# Patient Record
Sex: Male | Born: 1940 | Race: White | Hispanic: No | Marital: Married | Smoking: Current some day smoker
Health system: Southern US, Community
[De-identification: ages and names within clinical notes are randomized; demographics above are authoritative.]

## PROBLEM LIST (undated history)

## (undated) DIAGNOSIS — D472 Monoclonal gammopathy: Secondary | ICD-10-CM

## (undated) DIAGNOSIS — D6851 Activated protein C resistance: Secondary | ICD-10-CM

## (undated) DIAGNOSIS — G454 Transient global amnesia: Secondary | ICD-10-CM

## (undated) DIAGNOSIS — C801 Malignant (primary) neoplasm, unspecified: Secondary | ICD-10-CM

## (undated) DIAGNOSIS — Z87442 Personal history of urinary calculi: Secondary | ICD-10-CM

## (undated) DIAGNOSIS — I10 Essential (primary) hypertension: Secondary | ICD-10-CM

## (undated) DIAGNOSIS — I82409 Acute embolism and thrombosis of unspecified deep veins of unspecified lower extremity: Secondary | ICD-10-CM

## (undated) DIAGNOSIS — D759 Disease of blood and blood-forming organs, unspecified: Secondary | ICD-10-CM

## (undated) DIAGNOSIS — N400 Enlarged prostate without lower urinary tract symptoms: Secondary | ICD-10-CM

## (undated) DIAGNOSIS — H9319 Tinnitus, unspecified ear: Secondary | ICD-10-CM

## (undated) DIAGNOSIS — F419 Anxiety disorder, unspecified: Secondary | ICD-10-CM

## (undated) DIAGNOSIS — R319 Hematuria, unspecified: Secondary | ICD-10-CM

## (undated) HISTORY — PX: CHOLECYSTECTOMY: SHX55

## (undated) HISTORY — PX: SHOULDER SURGERY: SHX246

## (undated) HISTORY — PX: HAND SURGERY: SHX662

## (undated) HISTORY — PX: IVC FILTER PLACEMENT (ARMC HX): HXRAD1551

## (undated) HISTORY — PX: INNER EAR SURGERY: SHX679

## (undated) HISTORY — PX: ELBOW SURGERY: SHX618

## (undated) HISTORY — PX: APPENDECTOMY: SHX54

## (undated) HISTORY — PX: COLON SURGERY: SHX602

---

## 2005-12-16 ENCOUNTER — Inpatient Hospital Stay: Payer: Self-pay | Admitting: Internal Medicine

## 2006-01-02 ENCOUNTER — Encounter: Payer: Self-pay | Admitting: Internal Medicine

## 2009-10-17 ENCOUNTER — Inpatient Hospital Stay: Payer: Self-pay | Admitting: Internal Medicine

## 2009-11-06 ENCOUNTER — Ambulatory Visit: Payer: Self-pay | Admitting: Surgery

## 2010-02-06 ENCOUNTER — Ambulatory Visit: Payer: Self-pay | Admitting: Gastroenterology

## 2010-02-18 ENCOUNTER — Ambulatory Visit: Payer: Self-pay | Admitting: Surgery

## 2010-02-20 ENCOUNTER — Inpatient Hospital Stay: Payer: Self-pay | Admitting: Internal Medicine

## 2010-02-27 ENCOUNTER — Inpatient Hospital Stay: Payer: Self-pay | Admitting: Surgery

## 2010-03-01 LAB — PATHOLOGY REPORT

## 2010-03-15 ENCOUNTER — Inpatient Hospital Stay: Payer: Self-pay | Admitting: Surgery

## 2010-03-21 ENCOUNTER — Ambulatory Visit: Payer: Self-pay | Admitting: Surgery

## 2010-05-07 ENCOUNTER — Ambulatory Visit: Payer: Self-pay | Admitting: Vascular Surgery

## 2010-11-19 DIAGNOSIS — I749 Embolism and thrombosis of unspecified artery: Secondary | ICD-10-CM | POA: Insufficient documentation

## 2010-11-25 ENCOUNTER — Observation Stay: Payer: Self-pay | Admitting: Internal Medicine

## 2011-01-08 DIAGNOSIS — K319 Disease of stomach and duodenum, unspecified: Secondary | ICD-10-CM | POA: Insufficient documentation

## 2011-01-08 DIAGNOSIS — K30 Functional dyspepsia: Secondary | ICD-10-CM | POA: Insufficient documentation

## 2011-01-08 DIAGNOSIS — R1013 Epigastric pain: Secondary | ICD-10-CM | POA: Insufficient documentation

## 2011-07-16 LAB — COMPREHENSIVE METABOLIC PANEL
Albumin: 4.2 g/dL (ref 3.4–5.0)
BUN: 12 mg/dL (ref 7–18)
Bilirubin,Total: 0.6 mg/dL (ref 0.2–1.0)
Chloride: 112 mmol/L — ABNORMAL HIGH (ref 98–107)
Co2: 24 mmol/L (ref 21–32)
Creatinine: 1.2 mg/dL (ref 0.60–1.30)
Glucose: 93 mg/dL (ref 65–99)
Potassium: 4.1 mmol/L (ref 3.5–5.1)
SGPT (ALT): 24 U/L
Sodium: 144 mmol/L (ref 136–145)
Total Protein: 7.6 g/dL (ref 6.4–8.2)

## 2011-07-16 LAB — URINALYSIS, COMPLETE
Glucose,UR: NEGATIVE mg/dL (ref 0–75)
Leukocyte Esterase: NEGATIVE
Ph: 6 (ref 4.5–8.0)
Protein: 100
RBC,UR: 3751 /HPF (ref 0–5)
WBC UR: 9 /HPF (ref 0–5)

## 2011-07-16 LAB — CBC
HCT: 46.9 % (ref 40.0–52.0)
HGB: 16.1 g/dL (ref 13.0–18.0)
MCV: 99 fL (ref 80–100)
RBC: 4.76 10*6/uL (ref 4.40–5.90)
RDW: 13.1 % (ref 11.5–14.5)
WBC: 8.2 10*3/uL (ref 3.8–10.6)

## 2011-07-16 LAB — LIPASE, BLOOD: Lipase: 200 U/L (ref 73–393)

## 2011-07-16 LAB — PROTIME-INR: INR: 1.6

## 2011-07-17 ENCOUNTER — Observation Stay: Payer: Self-pay | Admitting: Urology

## 2011-07-18 LAB — PROTIME-INR: INR: 2.1

## 2011-07-19 LAB — PROTIME-INR: INR: 2.2

## 2011-07-22 DIAGNOSIS — D472 Monoclonal gammopathy: Secondary | ICD-10-CM | POA: Insufficient documentation

## 2011-07-22 DIAGNOSIS — N209 Urinary calculus, unspecified: Secondary | ICD-10-CM | POA: Insufficient documentation

## 2011-07-22 DIAGNOSIS — K5790 Diverticulosis of intestine, part unspecified, without perforation or abscess without bleeding: Secondary | ICD-10-CM | POA: Insufficient documentation

## 2011-07-22 DIAGNOSIS — K5730 Diverticulosis of large intestine without perforation or abscess without bleeding: Secondary | ICD-10-CM | POA: Insufficient documentation

## 2011-07-25 ENCOUNTER — Ambulatory Visit: Payer: Self-pay | Admitting: Urology

## 2011-08-25 ENCOUNTER — Ambulatory Visit: Payer: Self-pay | Admitting: Urology

## 2011-08-25 DIAGNOSIS — Z7901 Long term (current) use of anticoagulants: Secondary | ICD-10-CM | POA: Insufficient documentation

## 2011-08-27 ENCOUNTER — Ambulatory Visit: Payer: Self-pay | Admitting: Urology

## 2012-08-25 ENCOUNTER — Ambulatory Visit: Payer: Self-pay | Admitting: Urology

## 2012-08-25 DIAGNOSIS — N401 Enlarged prostate with lower urinary tract symptoms: Secondary | ICD-10-CM | POA: Insufficient documentation

## 2012-08-26 DIAGNOSIS — R39198 Other difficulties with micturition: Secondary | ICD-10-CM | POA: Insufficient documentation

## 2012-08-26 DIAGNOSIS — R310 Gross hematuria: Secondary | ICD-10-CM | POA: Insufficient documentation

## 2012-08-26 DIAGNOSIS — R300 Dysuria: Secondary | ICD-10-CM | POA: Insufficient documentation

## 2012-09-01 DIAGNOSIS — G454 Transient global amnesia: Secondary | ICD-10-CM | POA: Insufficient documentation

## 2012-09-01 DIAGNOSIS — I82409 Acute embolism and thrombosis of unspecified deep veins of unspecified lower extremity: Secondary | ICD-10-CM | POA: Insufficient documentation

## 2012-09-01 DIAGNOSIS — D472 Monoclonal gammopathy: Secondary | ICD-10-CM | POA: Insufficient documentation

## 2012-09-01 DIAGNOSIS — Z9049 Acquired absence of other specified parts of digestive tract: Secondary | ICD-10-CM | POA: Insufficient documentation

## 2012-09-01 DIAGNOSIS — N210 Calculus in bladder: Secondary | ICD-10-CM | POA: Insufficient documentation

## 2012-09-01 DIAGNOSIS — D6851 Activated protein C resistance: Secondary | ICD-10-CM | POA: Insufficient documentation

## 2012-09-01 DIAGNOSIS — K5730 Diverticulosis of large intestine without perforation or abscess without bleeding: Secondary | ICD-10-CM | POA: Insufficient documentation

## 2012-10-25 DIAGNOSIS — I10 Essential (primary) hypertension: Secondary | ICD-10-CM | POA: Insufficient documentation

## 2012-10-25 DIAGNOSIS — G4700 Insomnia, unspecified: Secondary | ICD-10-CM | POA: Insufficient documentation

## 2013-09-13 DIAGNOSIS — E785 Hyperlipidemia, unspecified: Secondary | ICD-10-CM | POA: Insufficient documentation

## 2013-09-13 DIAGNOSIS — F411 Generalized anxiety disorder: Secondary | ICD-10-CM | POA: Insufficient documentation

## 2014-02-10 DIAGNOSIS — Z8249 Family history of ischemic heart disease and other diseases of the circulatory system: Secondary | ICD-10-CM | POA: Insufficient documentation

## 2014-05-16 NOTE — Op Note (Signed)
PATIENT NAME:  Elijah Lopez, Elijah Lopez MR#:  [id number] DATE OF BIRTH:  25-May-1940  DATE OF PROCEDURE:  08/27/2011  PREOPERATIVE DIAGNOSIS: Right distal ureteral calculus.   POSTOPERATIVE DIAGNOSIS: Right distal ureteral calculus.   PROCEDURES:  1. Right ureteroscopy with stone removal.  2. Placement of right ureteral stent.   SURGEON: Scott C. Bernardo Heater, M.D.   ASSISTANT: None.   ANESTHESIA: General.   INDICATIONS: This is a 75 year old white male originally admitted in mid June 2013 with right renal colic secondary to 2 mm right distal ureteral calculus. He was having persistent nausea and elected ureteroscopic removal. He was taken to the operating room on 07/17/2011. The distal ureter was noted to be very small and could not be accessed with a ureteroscope. A small ureteral perforation was noted and a stent was placed without problems. He is not aware of passing a stone. He is having moderate stent symptoms. He presents for stent removal and ureteroscopy.   DESCRIPTION OF PROCEDURE: The patient was taken to the operating room where a general anesthetic was administered. He was placed in the low lithotomy position and his external genitalia were prepped and draped sterilely. Time out was performed. A 21 French cystoscope with 30 degree lens was lubricated and passed under direct vision. The urethra was normal in caliber without stricture. The prostate was remarkable for moderate lateral lobe enlargement. Bladder mucosa was closely inspected. There was no evidence of stone, solid or papillary lesions. The distal end of the ureteral stent was grasped with an endoscopic forceps and brought out through the urethral meatus. A 0.035 Glidewire was placed through the stent and passed up into the renal pelvis under fluoroscopic guidance. The stent was removed. A 6 French semirigid ureteroscope was then passed under direct vision. A 0.025 guidewire was placed through the ureteroscope and into the right ureteral  orifice. The right ureteral orifice was easily engaged. The area had been reepithelized. Within 1 cm of the ureter, a small stone was visualized. It was slightly embedded into the wall of the ureter and was dislodged with the tip of a 3 [place] nitinol basket. The stone was easily ensnared in the basket and removed. The ureteroscope was repassed. The guidewire was removed and retrograde pyelogram did show some persistent hang of contrast to the distal ureter. It was elected to replace the stent temporarily. The 0.035 guidewire was placed through the cystoscope and up into the renal pelvis. A 6 French/24 cm Contour ureteral stent was placed. There was good curl seen in the renal pelvis. The distal end of the stent was well positioned in the bladder. The bladder was emptied and the cystoscope was removed. The patient was taken to the PAC-U in stable condition. There were no complications. EBL was minimal.  ____________________________ Ronda Fairly. Bernardo Heater, MD scs:slb D: 08/27/2011 08:31:14 ET T: 08/27/2011 12:01:06 ET JOB#: [id number]  cc: Nicki Reaper C. Bernardo Heater, MD, <Dictator> Abbie Sons MD ELECTRONICALLY SIGNED 09/08/2011 13:24

## 2014-05-21 NOTE — Discharge Summary (Signed)
PATIENT NAME:  Elijah Lopez, Elijah Lopez MR#:  [id number] DATE OF BIRTH:  1940-10-07  DATE OF ADMISSION:  07/17/2011 DATE OF DISCHARGE:  07/19/2011  ADMISSION DIAGNOSES:  1. Right renal colic.  2. Right distal ureteral calculus.   DISCHARGE DIAGNOSES:  1. Right renal colic.  2. Right distal ureteral calculus.  3. Urinary retention.   PROCEDURE: 07/17/2011 cystoscopy with placement of right ureteral stent.   HISTORY OF PRESENT ILLNESS: 74 year old male presented to the Emergency Department 06/19 with severe right renal colic. CT was remarkable for mild hydronephrosis and a 2 mm right distal ureteral calculus. His pain could not be adequately controlled requiring admission. Refer to the admission history and physical for details.   HOSPITAL COURSE: Upon admission patient's pain significantly improved, however, he had persistent nausea and vomiting. He elected to proceed with ureteroscopy and was taken to the Operating Room on 07/17/2011. He was noted to have a very small ureter and the standard ureteroscope could not be advanced. A small ureteral perforation was noted. No further attempts at stone extraction were made and a stent was placed. After stent placement he had resolution of his pain and nausea. He did develop postoperative urinary retention which was managed with Foley catheter drainage. On 06/22 his catheter was removed and he was able to void at baseline.   DISPOSITION: Patient is discharged to home with activity as tolerated.   FOLLOW UP: He will follow up in approximately two weeks. His stent will be left indwelling approximately four weeks.   DISCHARGE MEDICATIONS:  1. Ceftin 500 mg p.o. b.i.d. x1 week.  2. Tamsulosin 0.4 mg p.o. daily.   CONDITION AT DISCHARGE: Good.   PROGNOSIS: Good.  ____________________________ Ronda Fairly. Bernardo Heater, MD scs:cms D: 07/19/2011 17:50:39 ET T: 07/20/2011 12:10:22 ET JOB#: [id number]  cc: Nicki Reaper C. Bernardo Heater, MD, <Dictator>  Abbie Sons  MD ELECTRONICALLY SIGNED 08/11/2011 9:12

## 2014-05-21 NOTE — Op Note (Signed)
PATIENT NAME:  Elijah Lopez, Elijah Lopez MR#:  [id number] DATE OF BIRTH:  07-Jun-1940  DATE OF PROCEDURE:  07/17/2011  PREOPERATIVE DIAGNOSIS: Right distal ureteral calculus.   POSTOPERATIVE DIAGNOSIS:  Right distal ureteral calculus.  PROCEDURES:  1. Right ureteroscopy.  2. Placement of right ureteral stent.   SURGEON: John Giovanni, M.D.   ASSISTANT: None.   ANESTHESIA: General.   INDICATIONS: 74 year old male admitted with severe right flank pain, nausea, and vomiting. CT was remarkable for a 2-mm right distal ureteral calculus with mild hydronephrosis. His pain had improved and he was having significant persistent nausea. He desired to proceed with ureteroscopic removal.   DESCRIPTION OF PROCEDURE: The patient was taken to the operating room where a general anesthetic was administered. He was placed in the low lithotomy position and his external genitalia were prepped and draped in the usual fashion. A 21 French cystoscope with 30-degree lens was lubricated and passed under direct vision. The urethra was normal in caliber without stricture. The prostate was remarkable for a moderate lateral lobe enlargement/bladder neck elevation. Panendoscopy was performed and the bladder mucosa was normal in appearance without erythema, solid or papillary lesions. There was clear efflux seen from the left ureteral orifice. No efflux was seen from the right ureteral orifice. A 0.035 Glidewire was placed through the cystoscope into the right ureter. The wire was placed up to the renal pelvis under fluoroscopic guidance. The cystoscope was removed and a 6 French semirigid ureteroscope was passed per urethra. The ureteral orifice visually appeared small, as most of the room was occupied by the previously placed guidewire. A second 0.025 guidewire was placed through the ureteroscope and into the right ureter. The ureteroscope was then advanced over the guidewire into the distal ureter. The ureter was quite tight and the  scope was gently advanced; however, it could not be easily advanced proximally. The scope was slightly withdrawn and a small ureteral perforation was noted. It was elected at this point to abandon the procedure and place a ureteral stent. The guidewire was back-loaded on a cystoscope. A 5 French open-ended ureteral catheter was then placed over the wire up to the ureteropelvic junction. Retrograde pyelogram was performed, which demonstrated the catheter in the correct position. No extravasation was noted. There was mild hydronephrosis present. After initial placement of the catheter, a significant hydronephrotic drip was noted. The guidewire was replaced and the ureteral catheter withdrawn. A 5 French/26-cm Contour ureteral stent was placed over the wire. Due to the small nature of the renal pelvis, a good curl was not able to be obtained. The proximal end of the stent was crooked within an upper pole calyx. The distal end of the stent was well positioned in the bladder. Significant efflux of urine was noted from the stent after placement. The bladder was emptied and the cystoscope was removed. The patient was taken to the PAC-U in stable condition.    ____________________________ Ronda Fairly Bernardo Heater, MD scs:bjt D: 07/19/2011 17:47:31 ET T: 07/20/2011 10:50:04 ET JOB#: [id number]  cc: Nicki Reaper C. Bernardo Heater, MD, <Dictator> Abbie Sons MD ELECTRONICALLY SIGNED 08/11/2011 9:11

## 2014-05-21 NOTE — H&P (Signed)
PATIENT NAME:  Elijah Lopez, Elijah Lopez MR#:  [id number] DATE OF BIRTH:  11/01/1940  DATE OF ADMISSION:  07/17/2011  ADMISSION DIAGNOSIS: Right renal colic.   HISTORY OF PRESENT ILLNESS: This is a 74 year old white male who developed right lower quadrant abdominal pain on 07/15/2011. Pain was moderate in severity with radiation to the right groin and right testis. He had significant increase in his pain on 06/19 associated with nausea and vomiting. He presented to the Emergency Department on the afternoon of 06/19. A stone protocol CT was performed which showed a 2 to 3 mm right distal ureteral calculus with mild hydronephrosis. Over the course of the afternoon, evening and early morning he received a total of 20 mg of morphine in addition to Toradol. His pain could not be adequately controlled and admission was requested. He denies any prior history of stone disease. He has had a prior history of postoperative urinary retention. Denies fever or chills.   PAST MEDICAL HISTORY:  1. 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  2. History of diverticulitis, status post colostomy in 2012.  3. Chronic tinnitus.  4. 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   PAST SURGICAL HISTORY:  1. Left shoulder surgery. 2. Right ear implant.  3. Colostomy in 2012.  4. Exploration/partial colon resection, takedown of colostomy December 2012.  5. Right hand/elbow surgery.  6. Removal of IVC filter April 2012.  ALLERGIES: He has multiple medication intolerances/allergies including amoxicillin, Ativan, codeine, Dilaudid, Elavil, erythromycin, hydrochlorothiazide, Naprosyn, Lecithin, Neurontin.   MEDICATIONS ON ADMISSION:  1. Zolpidem 10 mg at bedtime as needed. 2. Prilosec OTC 20 mg daily as needed. 3. Coumadin 4 mg Saturday and Sunday weekly; Coumadin 3 mg Monday through Friday. 4. Colace as needed.  5. Clonazepam 1 mg 3 times daily as needed.   6. ASA 81 mg as needed.   SOCIAL HISTORY: Denies tobacco or alcohol use.   REVIEW OF SYSTEMS: GENERAL: Denies fevers, chills. HEAD AND NECK: Denies headache. Chronic tinnitus. PULMONARY: Denies cough or shortness of breath. CARDIOVASCULAR: Denies chest pain, palpitations. GI: Positive abdominal pain, nausea, vomiting. GU: Denies frequency, urgency, dysuria, hematuria. ENDOCRINE: No thyroid disorder, diabetes. NEUROLOGIC: Possible TIA in the past, currently asymptomatic. HEME: Hypercoagulable disorder. MUSCULOSKELETAL: No new joint/back pain. PSYCH: History of anxiety.   PHYSICAL EXAMINATION:   VITAL SIGNS: Temperature 98.2, blood pressure 133/82, pulse 91.   GENERAL: The patient was in no acute distress.   HEENT: Moist mucous membranes.    PULMONARY: Lungs clear to auscultation.   CARDIOVASCULAR: Regular rate and rhythm without murmur.   ABDOMEN: Soft, nontender. No CVA tenderness.   GU: Deferred at this time.   EXTREMITIES: No edema.  NEUROLOGIC: No focal deficits.   DATA: Urinalysis amber with significant microhematuria. INR was 1.6. Creatinine 1.2. Hemoglobin and hematocrit 16.1/46.9.   CT scan was reviewed. There is approximately 2 to 3 mm right distal ureteral calculus with mild hydronephrosis/hydroureter.   IMPRESSION: Right renal colic secondary to a small right distal ureteral calculus.   PLAN:  1. The patient is admitted for parenteral analgesia.  2. Surgical options will also be discussed.  3. Start tamsulosin.   ____________________________ Ronda Fairly. Bernardo Heater, MD scs:drc D: 07/17/2011 08:08:49 ET T: 07/17/2011 10:10:10 ET JOB#: [id number]  cc: Nicki Reaper C. Bernardo Heater, MD, <Dictator> Abbie Sons MD ELECTRONICALLY SIGNED 08/13/2011 16:37

## 2014-08-12 DIAGNOSIS — R7303 Prediabetes: Secondary | ICD-10-CM | POA: Insufficient documentation

## 2014-11-14 DIAGNOSIS — Z85828 Personal history of other malignant neoplasm of skin: Secondary | ICD-10-CM | POA: Insufficient documentation

## 2015-06-24 DIAGNOSIS — R740 Nonspecific elevation of levels of transaminase and lactic acid dehydrogenase [LDH]: Secondary | ICD-10-CM

## 2015-06-24 DIAGNOSIS — R7401 Elevation of levels of liver transaminase levels: Secondary | ICD-10-CM | POA: Insufficient documentation

## 2015-07-17 DIAGNOSIS — F419 Anxiety disorder, unspecified: Secondary | ICD-10-CM | POA: Insufficient documentation

## 2015-10-28 DIAGNOSIS — Z9049 Acquired absence of other specified parts of digestive tract: Secondary | ICD-10-CM | POA: Insufficient documentation

## 2015-11-14 DIAGNOSIS — D6851 Activated protein C resistance: Secondary | ICD-10-CM | POA: Insufficient documentation

## 2015-11-21 ENCOUNTER — Encounter
Admission: RE | Admit: 2015-11-21 | Discharge: 2015-11-21 | Disposition: A | Discharge status: Home / Self Care | Payer: Medicare Other | Source: Ambulatory Visit | Attending: Otolaryngology | Admitting: Otolaryngology

## 2015-11-21 HISTORY — DX: Personal history of urinary calculi: Z87.442

## 2015-11-21 HISTORY — DX: Activated protein C resistance: D68.51

## 2015-11-21 HISTORY — DX: Disease of blood and blood-forming organs, unspecified: D75.9

## 2015-11-21 HISTORY — DX: Acute embolism and thrombosis of unspecified deep veins of unspecified lower extremity: I82.409

## 2015-11-21 HISTORY — DX: Anxiety disorder, unspecified: F41.9

## 2015-11-21 HISTORY — DX: Essential (primary) hypertension: I10

## 2015-11-21 HISTORY — DX: Malignant (primary) neoplasm, unspecified: C80.1

## 2015-11-21 HISTORY — DX: Benign prostatic hyperplasia without lower urinary tract symptoms: N40.0

## 2015-11-21 HISTORY — DX: Hematuria, unspecified: R31.9

## 2015-11-21 HISTORY — DX: Transient global amnesia: G45.4

## 2015-11-21 HISTORY — DX: Monoclonal gammopathy: D47.2

## 2015-11-21 HISTORY — DX: Tinnitus, unspecified ear: H93.19

## 2015-11-21 NOTE — Pre-Procedure Instructions (Signed)
CBC Component Name  Office Visit on 11/14/2015 [place]")' href="[email or web address]/">11/14/2015  Office Visit on 03/13/2015 [employer]")' href="[email or web address]/13/2015  Office Visit on 02/02/2013 [place]")' href="[email or web address]/02/2013  Office Visit on 10/25/2012 [place]")' href="[email or web address]/25/2012    Specimen: Blood")'>16.5  Specimen: Blood")'>16.3  Specimen: Blood")'>16.5 16.6   Specimen: Blood")'>47.4  Specimen: Blood")'>49.4 (H)  Specimen: Blood")'>0.47 0.46     4.73     35.0 (H)     35.7     13.4     98     244     7.5     5.7   Specimen: Blood")'>6.0  Specimen: Blood")'>6.3  Specimen: Blood")'>5.0    Specimen: Blood")'>4.80  Specimen: Blood")'>5.01  Specimen: Blood")'>4.80    Specimen: Blood")'>99 (H)  Specimen: Blood")'>99 (H)  Specimen: Blood")'>98    Specimen: Blood")'>34.4 (H)  Specimen: Blood")'>32.5  Specimen: Blood")'>34.3 (H)    Specimen: Blood")'>34.8  Specimen: Blood")'>33.0  Specimen: Blood")'>35.1    Specimen: Blood")'>278  Specimen: Blood")'>258  Specimen: Blood")'>255    Specimen: Blood")'>13.3  Specimen: Blood")'>13.3  Specimen: Blood")'>13.2    Specimen: Blood")'>10.4  Specimen: Blood")'>9.7  Specimen: Blood")'>7.6   Hemoglobin  Hematocrit  Red Blood Cell Count  MCH  MCHC  RDW-CV  MCV  Platelet Count /L  MPV  White Blood Cell Count  WBC (White Blood Cell Count)  RBC (Red Blood Cell Count)  MCV (Mean Corpuscular Volume)  MCH (Mean Corpuscular Hemoglobin)  MCHC (Mean Corpuscular Hemoglobin Concentration)  Plt (platelets)  RDW-CV (Red Cell Distribution Width)  MPV (Mean Platelet Volume)

## 2015-11-21 NOTE — Pre-Procedure Instructions (Signed)
Cleared by hematologist low risk and patient aware to no take xarelto tonight

## 2015-11-21 NOTE — Pre-Procedure Instructions (Deleted)
Name Value Ref Range  Vent Rate (bpm) 81   PR Interval (msec) 150   QRS Interval (msec) 78   QT Interval (msec) 382   QTc (msec) 443   Result Narrative  Normal sinus rhythm Normal ECG  No previous ECGs available I reviewed and concur with this report. Electronically signed ZY:SAYTK, MD, AUGUSTUS ([id number]) on 11/14/2015 4:15:39 PM  Status Results Details      Component Name  Office Visit on 10/18/2017Name  Office Visit on 11/14/2015 [place]")' href="epic://request1.2.840.114350.[id number]/">11/14/2015  Office Visit on 03/13/2015 [place]")' href="epic://request1.2.840.114350.1.1[id number]/">03/13/2015  Office Visit on 05/03/2013 [place]")' href="epic://request1.2.840.114350.1.13.324.2.7.8.688883.135349029/">05/03/2013  Office Visit on 03/17/2013 [place]")' href="epic://request1.2.840.114350.1.13.324.2.7.8.688883.135349032/">03/17/2013  Office Visit on 02/02/2013 [place]")' href="epic://request1.2.840.114350.1.13.324.2.7.8.688883.135349036/">02/02/2013  Office Visit on 10/25/2012 [place]")' href="epic://request1.2.840.114350.1.13.324.2.7.8.688883.135349041/">10/25/2012  Office Visit on 01/12/2012 [place]")' href="epic://request1.2.840.114350.1.13.324.2.7.8.688883.135349059/">01/12/2012  Office Visit on 01/08/2011 [place]")' href="epic://request1.2.840.114350.1.13.324.2.7.8.688883.135349081/">01/08/2011  Appointment on 06/20/2010 [place]")' href="epic://request1.2.840.114350.1.13.324.2.7.8.688883.135349292/">06/20/2010  Historical Outpatient on 12/26/2009 [place]")' href="epic://request1.2.840.114350.1.13.324.2.7.8.688883.135349120/">12/26/2009  Historical Outpatient on 12/15/2008 [place]")'  href="epic://request1.2.840.114350.1.13.324.2.7.8.688883.135349150/">12/15/2008  Historical Outpatient on 12/14/2007 [place]")' href="epic://request1.2.840.114350.1.13.324.2.7.8.688883.135349166/">12/14/2007  Historical Outpatient on 06/15/2007 [place]")' href="epic://request1.2.840.114350.1.13.324.2.7.8.688883.135349180/">06/15/2007  Historical Outpatient on 01/06/2007 [place]")' href="epic://request1.2.840.114350.1.13.324.2.7.8.688883.135349197/">01/06/2007  Historical Outpatient on 12/04/2005 [place]")' href="epic://request1.2.840.114350.1.13.324.2.7.8.688883.135349220/">12/04/2005  Historical Outpatient on 05/22/2005 [place]")' href="epic://request1.2.840.114350.1.13.324.2.7.8.688883.135349227/">05/22/2005  Historical Outpatient on 03/11/2005 [place]")' href="epic://request1.2.840.114350.1.13.324.2.7.8.688883.135349232/">03/11/2005  Orders Only on 11/25/1999 [place]")' href="epic://request1.2.840.114350.1.13.324.2.7.8.688883.135349283/">11/25/1999  Orders Only on 11/25/1999 [place]")' href="epic://request1.2.840.114350.1.13.324.2.7.8.688883.135349280/">11/25/1999  Orders Only on 10/24/1993 [place]")' href="epic://request1.2.840.114350.1.13.324.2.7.8.688883.135349267/">10/24/1993  Orders Only on 10/24/1993 [place]")' href="epic://request1.2.840.114350.1.13.324.2.7.8.688883.135349269/">10/24/1993   View Detailed Result Report Comprehensive Metabolic Panel CMP 16/01/930")' href="epic://ordersummary1.2.840.114350.1.13.324.2.7.2.798268^[id number] Comprehensive Metabolic Panel CMP/"> Specimen: Blood")'>141 View Detailed Result Report Comprehensive Metabolic Panel CMP 3/55/7322")'  href="epic://ordersummary1.2.840.114350.1.13.324.2.7.2.798268^[id number] Comprehensive Metabolic Panel CMP/"> Specimen: Blood")'>141 View Detailed Result Report Basic Metabolic Panel BMP 0/02/5425")' href="epic://ordersummary1.0.623.762831.5.17.616.0.7.3.710626^[id number] Basic Metabolic Panel BMP/"> Specimen: Blood")'>140 View Detailed Result Report Basic Metabolic Panel BMP 3/50/0938")' href="epic://ordersummary1.1.829.937169.6.78.938.1.0.1.751025^[id number] Basic Metabolic Panel BMP/"> Specimen: Blood")'>140 View Detailed Result Report Comprehensive Metabolic Panel CMP 03/31/3612")' href="epic://ordersummary1.2.840.114350.1.13.324.2.7.2.798268^[id number] Comprehensive Metabolic Panel CMP/"> Specimen: Blood")'>141 View Detailed Result Report Comprehensive Metabolic Panel CMP 4/31/5400")' href="epic://ordersummary1.2.840.114350.1.13.324.2.7.2.798268^[id number] Comprehensive Metabolic Panel CMP/"> Specimen: Blood")'>143 View Detailed Result Report Comprehensive Metabolic Panel CMP 86/76/1950")' href="epic://ordersummary1.2.840.114350.1.13.324.2.7.2.798268^[id number] Comprehensive Metabolic Panel DTO/">671 View Detailed Result Report DUAP Comprehensive Metabolic Panel 24/58/0998")' href="epic://ordersummary1.2.840.114350.1.13.324.2.7.2.798268^[id number] DUAP Comprehensive Metabolic PJASN/">053 View Detailed Result Report DUAP Comprehensive Metabolic Panel 9/76/7341")' href="epic://ordersummary1.2.840.114350.1.13.324.2.7.2.798268^[id number] DUAP Comprehensive Metabolic PFXTK/">240 View Detailed Result Report DUAP Comprehensive Metabolic Panel 97/35/3299")' href="epic://ordersummary1.2.840.114350.1.13.324.2.7.2.798268^[id number] DUAP Comprehensive Metabolic MEQAS/">341 View Detailed Result Report Comprehensive Metabolic Panel 96/22/2979")' href="epic://ordersummary1.2.840.114350.1.13.324.2.7.2.798268^14040542 Comprehensive Metabolic GXQJJ/">941 View Detailed Result Report Comprehensive  Metabolic Panel 74/08/1446")' href="epic://ordersummary1.2.840.114350.1.13.324.2.7.2.798268^[id number] Comprehensive Metabolic JEHUD/">149 View Detailed Result Report DUAP Comprehensive Metabolic Panel 07/28/6376")' href="epic://ordersummary1.2.840.114350.1.13.324.2.7.2.798268^[id number] DUAP Comprehensive Metabolic HYIFO/">277 View Detailed Result Report DUAP Comprehensive Metabolic Panel 41/28/7867")' href="epic://ordersummary1.2.840.114350.1.13.324.2.7.2.798268^[id number] DUAP Comprehensive Metabolic EHMCN/">470 View Detailed Result Report DUAP COMP.METABOLIC PANEL 96/02/8364")' href="epic://ordersummary1.2.840.114350.1.13.324.2.7.2.798268^4964830 DUAP COMP.METABOLIC QHUTM/">546 View Detailed Result Report DUAP COMP.METABOLIC PANEL 05/30/5463")' href="epic://ordersummary1.2.840.114350.1.13.324.2.7.2.798268^[id number] DUAP COMP.METABOLIC KCLEX/">517 View Detailed Result Report DUAP COMP.METABOLIC PANEL 0/01/7492")' href="epic://ordersummary1.2.840.114350.1.13.324.2.7.2.798268^[id number] DUAP COMP.METABOLIC WHQPR/">916      View Detailed Result Report Comprehensive Metabolic Panel CMP 38/46/6599")' href="epic://ordersummary1.2.840.114350.1.13.324.2.7.2.798268^[id number] Comprehensive Metabolic Panel CMP/"> Specimen: Blood")'>4.1 View Detailed Result Report Comprehensive Metabolic Panel CMP 3/57/0177")' href="epic://ordersummary1.2.840.114350.1.13.324.2.7.2.798268^[id number] Comprehensive Metabolic Panel CMP/"> Specimen: Blood")'>4.3 View Detailed Result Report Basic Metabolic Panel BMP 09/29/9028")' href="epic://ordersummary1.0.923.300762.2.63.335.4.5.6.256389^[id number] Basic Metabolic Panel BMP/"> Specimen: Blood")'>4.5 View Detailed Result Report Basic Metabolic Panel BMP 02/11/7260")' href="epic://ordersummary1.0.355.974163.8.45.364.6.8.0.321224^[id number] Basic Metabolic Panel BMP/"> Specimen: Blood")'>4.4 View Detailed Result Report Comprehensive Metabolic Panel CMP 09/04/8914")'  href="epic://ordersummary1.2.840.114350.1.13.324.2.7.2.798268^[id number] Comprehensive Metabolic Panel CMP/"> Specimen: Blood")'>4.2 View Detailed  Result Report Comprehensive Metabolic Panel CMP 0/10/9321")' href="epic://ordersummary1.2.840.114350.1.13.324.2.7.2.798268^[id number] Comprehensive Metabolic Panel CMP/"> Specimen: Blood")'>4.0 View Detailed Result Report Comprehensive Metabolic Panel CMP 55/73/2202")' href="epic://ordersummary1.2.840.114350.1.13.324.2.7.2.798268^[id number] Comprehensive Metabolic Panel RKY/">7.0    View Detailed Result Report Comprehensive Metabolic Panel 62/37/6283")' href="epic://ordersummary1.2.840.114350.1.13.324.2.7.2.798268^14040542 Comprehensive Metabolic TDVVO/">1.6 View Detailed Result Report Comprehensive Metabolic Panel 07/37/1062")' href="epic://ordersummary1.2.840.114350.1.13.324.2.7.2.798268^[id number] Comprehensive Metabolic IRSWN/">4.6   View Detailed Result Report DUAP COMP.METABOLIC PANEL 27/0/3500")' href="epic://ordersummary1.2.840.114350.1.13.324.2.7.2.798268^4964830 DUAP COMP.METABOLIC XFGHW/">2.9 View Detailed Result Report DUAP COMP.METABOLIC PANEL 9/37/1696")' href="epic://ordersummary1.2.840.114350.1.13.324.2.7.2.798268^[id number] DUAP COMP.METABOLIC VELFY/">1.0 View Detailed Result Report DUAP COMP.METABOLIC PANEL 1/75/1025")' href="epic://ordersummary1.2.840.114350.1.13.324.2.7.2.798268^[id number] DUAP COMP.METABOLIC ENIDP/">8.2      View Detailed Result Report Comprehensive Metabolic Panel CMP 42/35/3614")' href="epic://ordersummary1.2.840.114350.1.13.324.2.7.2.798268^[id number] Comprehensive Metabolic Panel CMP/"> Specimen: Blood")'>106 View Detailed Result Report Comprehensive Metabolic Panel CMP 4/31/5400")' href="epic://ordersummary1.2.840.114350.1.13.324.2.7.2.798268^[id number] Comprehensive Metabolic Panel CMP/"> Specimen: Blood")'>106 View Detailed Result Report Basic Metabolic Panel BMP 09/01/7617")'  href="epic://ordersummary1.5.093.267124.5.80.998.3.3.8.250539^767341937 Basic Metabolic Panel BMP/"> Specimen: Blood")'>106 View Detailed Result Report Basic Metabolic Panel BMP 09/29/4095")' href="epic://ordersummary1.3.532.992426.8.34.196.2.2.2.979892^119417408 Basic Metabolic Panel BMP/"> Specimen: Blood")'>105 View Detailed Result Report Comprehensive Metabolic Panel CMP 01/31/4816")' href="epic://ordersummary1.2.840.114350.1.13.324.2.7.2.798268^[id number] Comprehensive Metabolic Panel CMP/"> Specimen: Blood")'>105 View Detailed Result Report Comprehensive Metabolic Panel CMP 5/63/1497")' href="epic://ordersummary1.2.840.114350.1.13.324.2.7.2.798268^188724190 Comprehensive Metabolic Panel CMP/"> Specimen: Blood")'>106 View Detailed Result Report Comprehensive Metabolic Panel CMP 02/63/7858")' href="epic://ordersummary1.2.840.114350.1.13.324.2.7.2.798268^[id number] Comprehensive Metabolic Panel IFO/">277 View Detailed Result Report DUAP Comprehensive Metabolic Panel 41/28/7867")' href="epic://ordersummary1.2.840.114350.1.13.324.2.7.2.798268^[id number] DUAP Comprehensive Metabolic EHMCN/">470 View Detailed Result Report DUAP Comprehensive Metabolic Panel 9/62/8366")' href="epic://ordersummary1.2.840.114350.1.13.324.2.7.2.798268^32840825 DUAP Comprehensive Metabolic QHUTM/">546 View Detailed Result Report DUAP Comprehensive Metabolic Panel 50/35/4656")' href="epic://ordersummary1.2.840.114350.1.13.324.2.7.2.798268^[id number] DUAP Comprehensive Metabolic CLEXN/">170 View Detailed Result Report Comprehensive Metabolic Panel 01/74/9449")' href="epic://ordersummary1.2.840.114350.1.13.324.2.7.2.798268^14040542 Comprehensive Metabolic QPRFF/">638 View Detailed Result Report Comprehensive Metabolic Panel 46/65/9935")' href="epic://ordersummary1.2.840.114350.1.13.324.2.7.2.798268^28922829 Comprehensive Metabolic TSVXB/">939 (H) View Detailed Result Report DUAP Comprehensive Metabolic  Panel 0/30/0923")' href="epic://ordersummary1.2.840.114350.1.13.324.2.7.2.798268^20773832 DUAP Comprehensive Metabolic RAQTM/">226 View Detailed Result Report DUAP Comprehensive Metabolic Panel 33/35/4562")' href="epic://ordersummary1.2.840.114350.1.13.324.2.7.2.798268^18288044 DUAP Comprehensive Metabolic BWLSL/">373 View Detailed Result Report DUAP COMP.METABOLIC PANEL 42/08/7679")' href="epic://ordersummary1.2.840.114350.1.13.324.2.7.2.798268^4964830 DUAP COMP.METABOLIC LXBWI/">203 View Detailed Result Report DUAP COMP.METABOLIC PANEL 5/59/7416")' href="epic://ordersummary1.2.840.114350.1.13.324.2.7.2.798268^[id number] DUAP COMP.METABOLIC LAGTX/">646 View Detailed Result Report DUAP COMP.METABOLIC PANEL 08/29/2120")' href="epic://ordersummary1.2.840.114350.1.13.324.2.7.2.798268^[id number] DUAP COMP.METABOLIC QMGNO/">037           View Detailed Result Report Comprehensive Metabolic Panel CMP 0/48/8891")' href="epic://ordersummary1.2.840.114350.1.13.324.2.7.2.798268^[id number] Comprehensive Metabolic Panel CMP/"> Specimen: Blood")'>29 View Detailed Result Report Comprehensive Metabolic Panel CMP 69/45/0388")' href="epic://ordersummary1.2.840.114350.1.13.324.2.7.2.798268^[id number] Comprehensive Metabolic Panel EKC/">00 View Detailed Result Report DUAP Comprehensive Metabolic Panel 34/91/7915")' href="epic://ordersummary1.2.840.114350.1.13.324.2.7.2.798268^63419538 DUAP Comprehensive Metabolic AVWPV/">94 View Detailed Result Report DUAP Comprehensive Metabolic Panel 08/28/6551")' href="epic://ordersummary1.2.840.114350.1.13.324.2.7.2.798268^32840825 DUAP Comprehensive Metabolic ZSMOL/">07 View Detailed Result Report DUAP Comprehensive Metabolic Panel 86/75/4492")' href="epic://ordersummary1.2.840.114350.1.13.324.2.7.2.798268^[id number] DUAP Comprehensive Metabolic EFEOF/">12 View Detailed Result Report Comprehensive Metabolic Panel 19/75/8832")'  href="epic://ordersummary1.2.840.114350.1.13.324.2.7.2.798268^14040542 Comprehensive Metabolic PQDIY/">64 View Detailed Result Report Comprehensive Metabolic Panel 15/83/0940")' href="epic://ordersummary1.2.840.114350.1.13.324.2.7.2.798268^28922829 Comprehensive Metabolic HWKGS/">81 View Detailed Result Report DUAP Comprehensive Metabolic Panel 01/30/1592")' href="epic://ordersummary1.2.840.114350.1.13.324.2.7.2.798268^20773832 DUAP Comprehensive Metabolic VOPFY/">92 View Detailed Result Report DUAP Comprehensive Metabolic Panel 44/62/8638")' href="epic://ordersummary1.2.840.114350.1.13.324.2.7.2.798268^18288044 DUAP Comprehensive Metabolic TRRNH/">65 View Detailed Result Report DUAP COMP.METABOLIC PANEL 79/0/3833")' href="epic://ordersummary1.2.840.114350.1.13.324.2.7.2.798268^4964830 DUAP COMP.METABOLIC XOVAN/">19 View Detailed Result Report DUAP COMP.METABOLIC PANEL 1/66/0600")' href="epic://ordersummary1.2.840.114350.1.13.324.2.7.2.798268^16508710 DUAP COMP.METABOLIC KHTXH/">74 View Detailed Result Report DUAP COMP.METABOLIC PANEL 1/42/3953")' href="epic://ordersummary1.2.840.114350.1.13.324.2.7.2.798268^[id number] DUAP COMP.METABOLIC UYEBX/">43           View Detailed Result Report Comprehensive Metabolic Panel CMP 5/68/6168")' href="epic://ordersummary1.2.840.114350.1.13.324.2.7.2.798268^188724190 Comprehensive Metabolic Panel CMP/"> Specimen: Blood")'>11 View Detailed Result Report Comprehensive Metabolic Panel CMP 37/29/0211")' href="epic://ordersummary1.2.840.114350.1.13.324.2.7.2.798268^153801755 Comprehensive Metabolic Panel DBZ/">20 View Detailed Result Report DUAP Comprehensive Metabolic Panel 80/22/3361")' href="epic://ordersummary1.2.840.114350.1.13.324.2.7.2.798268^63419538 DUAP Comprehensive Metabolic Panel/">9 View Detailed Result Report DUAP Comprehensive Metabolic Panel 03/22/4973")'  href="epic://ordersummary1.2.840.114350.1.13.324.2.7.2.798268^32840825 DUAP Comprehensive Metabolic PYYFR/">10 View Detailed Result Report DUAP Comprehensive Metabolic Panel 21/11/7354")' href="epic://ordersummary1.2.840.114350.1.13.324.2.7.2.798268^25578476 DUAP Comprehensive Metabolic Panel/">8 View Detailed Result Report Comprehensive Metabolic Panel 70/14/1030")' href="epic://ordersummary1.2.840.114350.1.13.324.2.7.2.798268^14040542 Comprehensive Metabolic Panel/">9 View Detailed Result Report Comprehensive Metabolic Panel 13/14/3888")' href="epic://ordersummary1.2.840.114350.1.13.324.2.7.2.798268^28922829 Comprehensive Metabolic Panel/">9 View Detailed Result Report DUAP Comprehensive Metabolic Panel 7/57/9728")' href="epic://ordersummary1.2.840.114350.1.13.324.2.7.2.798268^20773832 DUAP Comprehensive Metabolic ASUOR/">56 View Detailed Result Report DUAP Comprehensive Metabolic Panel 15/37/9432")' href="epic://ordersummary1.2.840.114350.1.13.324.2.7.2.798268^[id number] DUAP Comprehensive  Metabolic PIRJJ/">88 View Detailed Result Report DUAP COMP.METABOLIC PANEL 41/06/6061")' href="epic://ordersummary1.2.840.114350.1.13.324.2.7.2.798268^4964830 DUAP COMP.METABOLIC PANEL/">9 View Detailed Result Report DUAP COMP.METABOLIC PANEL 0/16/0109")' href="epic://ordersummary1.2.840.114350.1.13.324.2.7.2.798268^16508710 DUAP COMP.METABOLIC NATFT/">73 View Detailed Result Report DUAP COMP.METABOLIC PANEL 03/18/2540")' href="epic://ordersummary1.2.840.114350.1.13.324.2.7.2.798268^[id number] DUAP COMP.METABOLIC HCWCB/">76      View Detailed Result Report Comprehensive Metabolic Panel CMP 28/31/5176")' href="epic://ordersummary1.2.840.114350.1.13.324.2.7.2.798268^346342004 Comprehensive Metabolic Panel CMP/"> Specimen: Blood")'>1.0 View Detailed Result Report Comprehensive Metabolic Panel CMP 1/60/7371")'  href="epic://ordersummary1.2.840.114350.1.13.324.2.7.2.798268^[id number] Comprehensive Metabolic Panel CMP/"> Specimen: Blood")'>1.1 View Detailed Result Report Basic Metabolic Panel BMP 0/06/2692")' href="epic://ordersummary1.8.546.270350.0.93.818.2.9.9.371696^789381017 Basic Metabolic Panel BMP/"> Specimen: Blood")'>1.0 View Detailed Result Report Basic Metabolic Panel BMP 06/06/2583")' href="epic://ordersummary1.2.778.242353.6.14.431.5.4.0.086761^950932671 Basic Metabolic Panel BMP/"> Specimen: Blood")'>1.0 View Detailed Result Report Comprehensive Metabolic Panel CMP 03/03/5807")' href="epic://ordersummary1.2.840.114350.1.13.324.2.7.2.798268^213505336 Comprehensive Metabolic Panel CMP/"> Specimen: Blood")'>1.0 View Detailed Result Report Comprehensive Metabolic Panel CMP 9/83/3825")' href="epic://ordersummary1.2.840.114350.1.13.324.2.7.2.798268^188724190 Comprehensive Metabolic Panel CMP/"> Specimen: Blood")'>1.1 View Detailed Result Report Comprehensive Metabolic Panel CMP 05/39/7673")' href="epic://ordersummary1.2.840.114350.1.13.324.2.7.2.798268^153801755 Comprehensive Metabolic Panel ALP/">3.7    View Detailed Result Report Comprehensive Metabolic Panel 90/24/0973")' href="epic://ordersummary1.2.840.114350.1.13.324.2.7.2.798268^14040542 Comprehensive Metabolic ZHGDJ/">2.4 View Detailed Result Report Comprehensive Metabolic Panel 26/83/4196")' href="epic://ordersummary1.2.840.114350.1.13.324.2.7.2.798268^28922829 Comprehensive Metabolic QIWLN/">9.8   View Detailed Result Report DUAP COMP.METABOLIC PANEL 92/01/1939")' href="epic://ordersummary1.2.840.114350.1.13.324.2.7.2.798268^4964830 DUAP COMP.METABOLIC DEYCX/">4.4 View Detailed Result Report DUAP COMP.METABOLIC PANEL 09/13/5629")' href="epic://ordersummary1.2.840.114350.1.13.324.2.7.2.798268^16508710 DUAP COMP.METABOLIC SHFWY/">6.3 View Detailed Result Report DUAP COMP.METABOLIC PANEL 7/85/8850")'  href="epic://ordersummary1.2.840.114350.1.13.324.2.7.2.798268^[id number] DUAP COMP.METABOLIC YDXAJ/">2.8      View Detailed Result Report Comprehensive Metabolic Panel CMP 78/67/6720")' href="epic://ordersummary1.2.840.114350.1.13.324.2.7.2.798268^346342004 Comprehensive Metabolic Panel CMP/"> Specimen: Blood")'>9.3 View Detailed Result Report Comprehensive Metabolic Panel CMP 9/47/0962")' href="epic://ordersummary1.2.840.114350.1.13.324.2.7.2.798268^294745482 Comprehensive Metabolic Panel CMP/"> Specimen: Blood")'>9.4 View Detailed Result Report Basic Metabolic Panel BMP 08/30/6627")' href="epic://ordersummary1.4.765.465035.4.65.681.2.7.5.170017^494496759 Basic Metabolic Panel BMP/"> Specimen: Blood")'>9.0 View Detailed Result Report Basic Metabolic Panel BMP 1/63/8466")' href="epic://ordersummary1.5.993.570177.9.39.030.0.9.2.330076^226333545 Basic Metabolic Panel BMP/"> Specimen: Blood")'>9.3 View Detailed Result Report Comprehensive Metabolic Panel CMP 06/28/5636")' href="epic://ordersummary1.2.840.114350.1.13.324.2.7.2.798268^213505336 Comprehensive Metabolic Panel CMP/"> Specimen: Blood")'>9.4 View Detailed Result Report Comprehensive Metabolic Panel CMP 9/37/3428")' href="epic://ordersummary1.2.840.114350.1.13.324.2.7.2.798268^188724190 Comprehensive Metabolic Panel CMP/"> Specimen: Blood")'>9.4 View Detailed Result Report Comprehensive Metabolic Panel CMP 76/81/1572")' href="epic://ordersummary1.2.840.114350.1.13.324.2.7.2.798268^153801755 Comprehensive Metabolic Panel IOM/">3.5 View Detailed Result Report DUAP Comprehensive Metabolic Panel 59/74/1638")' href="epic://ordersummary1.2.840.114350.1.13.324.2.7.2.798268^63419538 DUAP Comprehensive Metabolic GTXMI/">6.8 View Detailed Result Report DUAP Comprehensive Metabolic Panel 0/32/1224")' href="epic://ordersummary1.2.840.114350.1.13.324.2.7.2.798268^32840825 DUAP Comprehensive Metabolic MGNOI/">3.7 View Detailed  Result Report DUAP Comprehensive Metabolic Panel 04/88/8916")' href="epic://ordersummary1.2.840.114350.1.13.324.2.7.2.798268^[id number] DUAP Comprehensive Metabolic XIHWT/">8.8 View Detailed Result Report Comprehensive Metabolic Panel 82/80/0349")' href="epic://ordersummary1.2.840.114350.1.13.324.2.7.2.798268^14040542 Comprehensive Metabolic ZPHXT/">0.5 View Detailed Result Report Comprehensive Metabolic Panel 69/79/4801")' href="epic://ordersummary1.2.840.114350.1.13.324.2.7.2.798268^28922829 Comprehensive Metabolic KPVVZ/">4.8 View Detailed Result Report DUAP Comprehensive Metabolic Panel 2/70/7867")' href="epic://ordersummary1.2.840.114350.1.13.324.2.7.2.798268^20773832 DUAP Comprehensive Metabolic JQGBE/">0.1 (L) View Detailed Result Report DUAP Comprehensive Metabolic Panel 00/71/2197")' href="epic://ordersummary1.2.840.114350.1.13.324.2.7.2.798268^[id number] DUAP Comprehensive Metabolic JOITG/">5.4 View Detailed Result Report DUAP COMP.METABOLIC PANEL 98/02/6413")' href="epic://ordersummary1.2.840.114350.1.13.324.2.7.2.798268^4964830 DUAP COMP.METABOLIC AXENM/">0.7 View Detailed Result Report DUAP COMP.METABOLIC PANEL 6/80/8811")' href="epic://ordersummary1.2.840.114350.1.13.324.2.7.2.798268^[id number] DUAP COMP.METABOLIC SRPRX/">4.5 View Detailed Result Report DUAP COMP.METABOLIC PANEL 8/59/2924")' href="epic://ordersummary1.2.840.114350.1.13.324.2.7.2.798268^[id number] DUAP COMP.METABOLIC MQKMM/">3.8      View Detailed Result Report Comprehensive Metabolic Panel CMP 17/71/1657")' href="epic://ordersummary1.2.840.114350.1.13.324.2.7.2.798268^346342004 Comprehensive Metabolic Panel CMP/"> Specimen: Blood")'>6.8 View Detailed Result Report Comprehensive Metabolic Panel CMP 09/30/8331")' href="[email or web address]^[id number] Comprehensive Metabolic Panel CMP/"> Specimen: Blood")'>6.9   View Detailed Result Report Comprehensive Metabolic  Panel CMP 08/29/2917")' href="[email or web address]://[email or web address]^[id number] Comprehensive Metabolic Panel CMP/"> Specimen: Blood")'>7.2 View Detailed Result Report Comprehensive Metabolic Panel CMP 1/66/0600")' href="[email or web address]://[email or web address]^[id number] Comprehensive Metabolic Panel CMP/"> Specimen: Blood")'>7.3 View Detailed Result Report Comprehensive Metabolic Panel CMP 45/99/7741")' href="[email or web address]://[email or web address].7.2.798268^[id number] Comprehensive Metabolic Panel SEL/">9.5 View Detailed Result Report DUAP Comprehensive Metabolic Panel 32/02/3341")' href="[email or web address]://[email or web address].7.2.798268^[id number] DUAP Comprehensive Metabolic HWYSH/">6.8 View Detailed Result Report DUAP Comprehensive Metabolic Panel 3/72/9021")' href="[email or web address]://[email or web address].7.2.798268^[id number] DUAP Comprehensive Metabolic JDBZM/">0.8 View Detailed Result Report DUAP Comprehensive Metabolic Panel 03/21/3610")' href="[email or web address]://[email or web address].7.2.798268^[id number] DUAP Comprehensive Metabolic AESLP/">5.3 View Detailed Result Report Comprehensive Metabolic Panel 00/51/1021")' href="epic://[email or web address].7.2.798268^[id number] Comprehensive Metabolic RZNBV/">6.7 View Detailed Result Report Comprehensive Metabolic Panel 01/41/0301")' href="epic://[email or web address].7.2.798268^[id number] Comprehensive Metabolic THYHO/">8.8 View Detailed Result Report DUAP Comprehensive Metabolic Panel 7/57/9728")' href="[email or web address]98268^[id number] DUAP Comprehensive Metabolic ASUOR/">5.6 View Detailed Result Report DUAP Comprehensive Metabolic Panel 15/37/9432")' href="epic://ordersummary1.2.840.114350.1.13.324.2.7.2.798268^[id number] DUAP  Comprehensive Metabolic HUTML/">4.6 View Detailed Result Report DUAP COMP.METABOLIC  PANEL 50/03/5463")' href="epic://ordersummary1.2.840.114350.1.13.324.2.7.2.798268^[id number] DUAP COMP.METABOLIC KCLEX/">5.1 View Detailed Result Report DUAP COMP.METABOLIC PANEL 7/00/1749")' href="epic://ordersummary1.2.840.114350.1.13.324.2.7.2.798268^[id number] DUAP COMP.METABOLIC SWHQP/">5.9 View Detailed Result Report DUAP COMP.METABOLIC PANEL 1/63/8466")' href="epic://ordersummary1.2.840.114350.1.13.324.2.7.2.798268^[id number] DUAP COMP.METABOLIC ZLDJT/">7.0      View Detailed Result Report Comprehensive Metabolic Panel CMP 17/79/3903")' href="epic://ordersummary1.2.840.114350.1.13.324.2.7.2.798268^[id number] Comprehensive Metabolic Panel CMP/"> Specimen: Blood")'>4.3 View Detailed Result Report Comprehensive Metabolic Panel CMP 0/09/2328")' href="epic://ordersummary1.2.840.114350.1.13.324.2.7.2.798268^[id number] Comprehensive Metabolic Panel CMP/"> Specimen: Blood")'>4.5   View Detailed Result Report Comprehensive Metabolic Panel CMP 0/07/6224")' href="epic://ordersummary1.2.840.114350.1.13.324.2.7.2.798268^[id number] Comprehensive Metabolic Panel CMP/"> Specimen: Blood")'>4.4 View Detailed Result Report Comprehensive Metabolic Panel CMP 3/33/5456")' href="epic://ordersummary1.2.840.114350.1.13.324.2.7.2.798268^[id number] Comprehensive Metabolic Panel CMP/"> Specimen: Blood")'>4.4 View Detailed Result Report Comprehensive Metabolic Panel CMP 25/63/8937")' href="epic://ordersummary1.2.840.114350.1.13.324.2.7.2.798268^[id number] Comprehensive Metabolic Panel DSK/">8.7 View Detailed Result Report DUAP Comprehensive Metabolic Panel 68/11/5724")' href="epic://ordersummary1.2.840.114350.1.13.324.2.7.2.798268^[id number] DUAP Comprehensive Metabolic OMBTD/">9.7 View Detailed Result Report DUAP Comprehensive Metabolic Panel 05/13/3843")' href="epic://ordersummary1.2.840.114350.1.13.324.2.7.2.798268^[id number] DUAP Comprehensive Metabolic XMIWO/">0.3 View Detailed Result Report DUAP  Comprehensive Metabolic Panel 21/22/4825")' href="epic://ordersummary1.2.840.114350.1.13.324.2.7.2.798268^[id number] DUAP Comprehensive Metabolic OIBBC/">4.8 View Detailed Result Report Comprehensive Metabolic Panel 88/91/6945")' href="epic://ordersummary1.2.840.114350.1.13.324.2.7.2.798268^[id number] Comprehensive Metabolic WTUUE/">2.8 View Detailed Result Report Comprehensive Metabolic Panel 00/34/9179")' href="epic://ordersummary1.2.840.114350.1.13.324.2.7.2.798268^[id number] Comprehensive Metabolic XTAVW/">9.7 View Detailed Result Report DUAP Comprehensive Metabolic Panel 9/48/0165")' href="epic://ordersummary1.2.840.114350.1.13.324.2.7.2.798268^[id number] DUAP Comprehensive Metabolic VVZSM/">2.7 View Detailed Result Report DUAP Comprehensive Metabolic Panel 07/86/7544")' href="epic://ordersummary1.2.840.114350.1.13.324.2.7.2.798268^[id number] DUAP Comprehensive Metabolic BEEFE/">0.7 View Detailed Result Report DUAP COMP.METABOLIC PANEL 12/27/9756")' href="epic://ordersummary1.2.840.114350.1.13.324.2.7.2.798268^[id number] DUAP COMP.METABOLIC ITGPQ/">9.8 View Detailed Result Report DUAP COMP.METABOLIC PANEL 2/64/1583")' href="epic://ordersummary1.2.840.114350.1.13.324.2.7.2.798268^[id number] DUAP COMP.METABOLIC ENMMH/">6.8 View Detailed Result Report DUAP COMP.METABOLIC PANEL 0/88/1103")' href="epic://ordersummary1.2.840.114350.1.13.324.2.7.2.798268^[id number] DUAP COMP.METABOLIC PRXYV/">8.5      View Detailed Result Report Comprehensive Metabolic Panel CMP 92/92/4462")' href="epic://ordersummary1.2.840.114350.1.13.324.2.7.2.798268^[id number] Comprehensive Metabolic Panel CMP/"> Specimen: Blood")'>0.7 View Detailed Result Report Comprehensive Metabolic Panel CMP 8/63/8177")' href="epic://ordersummary1.2.840.114350.1.13.324.2.7.2.798268^[id number] Comprehensive Metabolic Panel CMP/"> Specimen: Blood")'>0.7   View Detailed Result Report Comprehensive Metabolic Panel CMP 01/27/6577")'  href="epic://ordersummary1.2.840.114350.1.13.324.2.7.2.798268^[id number] Comprehensive Metabolic Panel CMP/"> Specimen: Blood")'>1.0 View Detailed Result Report Comprehensive Metabolic Panel CMP 0/38/3338")' href="epic://ordersummary1.2.840.114350.1.13.324.2.7.2.798268^[id number] Comprehensive Metabolic Panel CMP/"> Specimen: Blood")'>0.8 View Detailed Result Report Comprehensive Metabolic Panel CMP 32/91/9166")' href="epic://ordersummary1.2.840.114350.1.13.324.2.7.2.798268^[id number] Comprehensive Metabolic Panel MAY/">0.4 View Detailed Result Report DUAP Comprehensive Metabolic Panel 59/97/7414")' href="epic://ordersummary1.2.840.114350.1.13.324.2.7.2.798268^[id number] DUAP Comprehensive Metabolic ELTRV/">2.0 View Detailed Result Report DUAP Comprehensive Metabolic Panel 2/33/4356")' href="epic://ordersummary1.2.840.114350.1.13.324.2.7.2.798268^[id number] DUAP Comprehensive Metabolic YSHUO/">3.7 View Detailed Result Report DUAP Comprehensive Metabolic Panel 29/02/1113")' href="epic://ordersummary1.2.840.114350.1.13.324.2.7.2.798268^[id number] DUAP Comprehensive Metabolic ZMCEY/">2.2 View Detailed Result Report Comprehensive Metabolic Panel 33/61/2244")' href="epic://ordersummary1.2.840.114350.1.13.324.2.7.2.798268^[id number] Comprehensive Metabolic LPNPY/">0.5 View Detailed Result Report Comprehensive Metabolic Panel 11/29/1115")' href="epic://ordersummary1.2.840.114350.1.13.324.2.7.2.798268^[id number] Comprehensive Metabolic BVAPO/">1.4 View Detailed Result Report DUAP Comprehensive Metabolic Panel 01/29/129")' href="epic://ordersummary1.2.840.114350.1.13.324.2.7.2.798268^[id number] DUAP Comprehensive Metabolic YHOOI/">7.5 View Detailed Result Report DUAP Comprehensive Metabolic Panel 79/72/8206")' href="epic://ordersummary1.2.840.114350.1.13.324.2.7.2.798268^[id number] DUAP Comprehensive Metabolic ORVIF/">5.3 View Detailed Result Report DUAP COMP.METABOLIC PANEL 79/04/3274")'  href="epic://ordersummary1.2.840.114350.1.13.324.2.7.2.798268^[id number] DUAP COMP.METABOLIC DYJWL/">2.9 View Detailed Result Report DUAP COMP.METABOLIC PANEL 5/74/7340")' href="epic://ordersummary1.2.840.114350.1.13.324.2.7.2.798268^[id number] DUAP COMP.METABOLIC ZJQDU/">4.3 View Detailed Result Report DUAP COMP.METABOLIC PANEL 8/38/1840")' href="epic://ordersummary1.2.840.114350.1.13.324.2.7.2.798268^[id number] DUAP COMP.METABOLIC RFVOH/">6.0           View Detailed Result Report Comprehensive Metabolic Panel CMP 6/77/0340")' href="epic://ordersummary1.2.840.114350.1.13.324.2.7.2.798268^[id number] Comprehensive Metabolic Panel CMP/"> Specimen: Blood")'>88 View Detailed Result Report Comprehensive Metabolic Panel CMP 35/24/8185")' href="epic://ordersummary1.2.840.114350.1.13.324.2.7.2.798268^[id number] Comprehensive Metabolic Panel TMB/">31    View Detailed Result Report Comprehensive Metabolic Panel 01/11/2445")' href="epic://ordersummary1.2.840.114350.1.13.324.2.7.2.798268^[id number] Comprehensive Metabolic XFQHK/">25 View Detailed Result Report Comprehensive Metabolic Panel 75/05/1831")' href="epic://ordersummary1.2.840.114350.1.13.324.2.7.2.798268^[id number] Comprehensive Metabolic POIPP/">89   View Detailed Result Report DUAP COMP.METABOLIC PANEL 84/02/1029")' href="epic://ordersummary1.2.840.114350.1.13.324.2.7.2.798268^[id number] DUAP COMP.METABOLIC YOFVW/">86 View Detailed Result Report DUAP COMP.METABOLIC PANEL 7/73/7366")' href="epic://ordersummary1.2.840.114350.1.13.324.2.7.2.798268^[id number] DUAP COMP.METABOLIC KDPTE/">70 View Detailed Result Report DUAP COMP.METABOLIC PANEL 7/61/5183")' href="epic://ordersummary1.2.840.114350.1.13.324.2.7.2.798268^[id number] DUAP COMP.METABOLIC UPBDH/">78             View Detailed Result Report DUAP Comprehensive Metabolic Panel 97/84/7841")' href="epic://ordersummary1.2.840.114350.1.13.324.2.7.2.798268^[id number] DUAP  Comprehensive Metabolic QKSKS/">13 View Detailed Result Report DUAP Comprehensive Metabolic Panel 8/87/1959")' href="epic://ordersummary1.2.840.114350.1.13.324.2.7.2.798268^[id number] DUAP Comprehensive Metabolic DIXVE/">55 View Detailed Result Report DUAP Comprehensive Metabolic Panel 01/58/6825")' href="epic://ordersummary1.2.840.114350.1.13.324.2.7.2.798268^[id number] DUAP Comprehensive Metabolic RKVTX/">52   View Detailed Result Report DUAP Comprehensive Metabolic Panel  06/15/2007")' href="epic://ordersummary1.2.840.114350.1.13.324.2.7.2.798268^[id number] DUAP Comprehensive Metabolic CWCBJ/">62 View Detailed Result Report DUAP Comprehensive Metabolic Panel 83/15/1761")' href="epic://ordersummary1.2.840.114350.1.13.324.2.7.2.798268^[id number] DUAP Comprehensive Metabolic YWVPX/">10 View Detailed Result Report DUAP COMP.METABOLIC PANEL 62/06/9483")' href="epic://ordersummary1.2.840.114350.1.13.324.2.7.2.798268^[id number] DUAP COMP.METABOLIC IOEVO/">35 View Detailed Result Report DUAP COMP.METABOLIC PANEL 0/09/3816")' href="epic://ordersummary1.2.840.114350.1.13.324.2.7.2.798268^[id number] DUAP COMP.METABOLIC EXHBZ/">16 View Detailed Result Report DUAP COMP.METABOLIC PANEL 9/67/8938")' href="epic://ordersummary1.2.840.114350.1.13.324.2.7.2.798268^[id number] DUAP COMP.METABOLIC BOFBP/">10           View Detailed Result Report Comprehensive Metabolic Panel CMP 2/58/5277")' href="epic://ordersummary1.2.840.114350.1.13.324.2.7.2.798268^[id number] Comprehensive Metabolic Panel CMP/"> Specimen: Blood")'>20 View Detailed Result Report Comprehensive Metabolic Panel CMP 82/42/3536")' href="epic://ordersummary1.2.840.114350.1.13.324.2.7.2.798268^[id number] Comprehensive Metabolic Panel RWE/">31 View Detailed Result Report DUAP Comprehensive Metabolic Panel 54/00/8676")' href="epic://ordersummary1.2.840.114350.1.13.324.2.7.2.798268^[id number] DUAP Comprehensive Metabolic PPJKD/">32 View  Detailed Result Report DUAP Comprehensive Metabolic Panel 6/71/2458")' href="epic://ordersummary1.2.840.114350.1.13.324.2.7.2.798268^[id number] DUAP Comprehensive Metabolic KDXIP/">38 View Detailed Result Report DUAP Comprehensive Metabolic Panel 25/05/3974")' href="epic://ordersummary1.2.840.114350.1.13.324.2.7.2.798268^[id number] DUAP Comprehensive Metabolic BHALP/">37 View Detailed Result Report Comprehensive Metabolic Panel 90/24/0973")' href="epic://ordersummary1.2.840.114350.1.13.324.2.7.2.798268^[id number] Comprehensive Metabolic ZHGDJ/">24 View Detailed Result Report Comprehensive Metabolic Panel 26/83/4196")' href="epic://ordersummary1.2.840.114350.1.13.324.2.7.2.798268^[id number] Comprehensive Metabolic QIWLN/">98 View Detailed Result Report DUAP Comprehensive Metabolic Panel 10/18/1939")' href="epic://ordersummary1.2.840.114350.1.13.324.2.7.2.798268^[id number] DUAP Comprehensive Metabolic DEYCX/">44 View Detailed Result Report DUAP Comprehensive Metabolic Panel 81/85/6314")' href="epic://ordersummary1.2.840.114350.1.13.324.2.7.2.798268^[id number] DUAP Comprehensive Metabolic HFWYO/">37 View Detailed Result Report DUAP COMP.METABOLIC PANEL 85/08/8500")' href="epic://ordersummary1.2.840.114350.1.13.324.2.7.2.798268^[id number] DUAP COMP.METABOLIC DXAJO/">87 View Detailed Result Report DUAP COMP.METABOLIC PANEL 8/67/6720")' href="epic://ordersummary1.2.840.114350.1.13.324.2.7.2.798268^[id number] DUAP COMP.METABOLIC NOBSJ/">62 View Detailed Result Report DUAP COMP.METABOLIC PANEL 8/36/6294")' href="epic://ordersummary1.2.840.114350.1.13.324.2.7.2.798268^[id number] DUAP COMP.METABOLIC TMLYY/">50      View Detailed Result Report Comprehensive Metabolic Panel CMP 35/46/5681")' href="epic://ordersummary1.2.840.114350.1.13.324.2.7.2.798268^[id number] Comprehensive Metabolic Panel CMP/"> Specimen: Blood")'>75 View Detailed Result Report Comprehensive Metabolic Panel CMP 2/75/1700")'  href="epic://ordersummary1.2.840.114350.1.13.324.2.7.2.798268^[id number] Comprehensive Metabolic Panel CMP/"> Specimen: Blood")'>102 View Detailed Result Report Basic Metabolic Panel BMP 02/02/4942")' href="epic://ordersummary1.9.675.916384.6.65.993.5.7.0.177939^[id number] Basic Metabolic Panel BMP/"> Specimen: Blood")'>70 View Detailed Result Report Basic Metabolic Panel BMP 0/76/2263")' href="epic://ordersummary1.3.354.562563.8.93.734.2.8.7.681157^[id number] Basic Metabolic Panel BMP/"> Specimen: Blood")'>76 View Detailed Result Report Comprehensive Metabolic Panel CMP 04/28/6382")' href="epic://ordersummary1.2.840.114350.1.13.324.2.7.2.798268^[id number] Comprehensive Metabolic Panel CMP/"> Specimen: Blood")'>93 View Detailed Result Report Comprehensive Metabolic Panel CMP 5/36/4680")' href="epic://ordersummary1.2.840.114350.1.13.324.2.7.2.798268^[id number] Comprehensive Metabolic Panel CMP/"> Specimen: Blood")'>87 View Detailed Result Report Comprehensive Metabolic Panel CMP 32/12/2480")' href="epic://ordersummary1.2.840.114350.1.13.324.2.7.2.798268^[id number] Comprehensive Metabolic Panel NOI/">37 View Detailed Result Report DUAP Comprehensive Metabolic Panel 04/88/8916")' href="epic://ordersummary1.2.840.114350.1.13.324.2.7.2.798268^[id number] DUAP Comprehensive Metabolic XIHWT/">88 View Detailed Result Report DUAP Comprehensive Metabolic Panel 09/23/32")' href="epic://ordersummary1.2.840.114350.1.13.324.2.7.2.798268^[id number] DUAP Comprehensive Metabolic JZPHX/">50 View Detailed Result Report DUAP Comprehensive Metabolic Panel 56/97/9480")' href="epic://ordersummary1.2.840.114350.1.13.324.2.7.2.798268^[id number] DUAP Comprehensive Metabolic XKPVV/">74 View Detailed Result Report Comprehensive Metabolic Panel 82/70/7867")' href="epic://ordersummary1.2.840.114350.1.13.324.2.7.2.798268^[id number] Comprehensive Metabolic JQGBE/">01 View Detailed Result Report Comprehensive Metabolic  Panel 00/71/2197")' href="epic://ordersummary1.2.840.114350.1.13.324.2.7.2.798268^[id number] Comprehensive Metabolic JOITG/">54 View Detailed Result Report DUAP Comprehensive Metabolic Panel 9/82/6415")' href="epic://ordersummary1.2.840.114350.1.13.324.2.7.2.798268^[id number] DUAP Comprehensive Metabolic AXENM/">07 View Detailed Result Report DUAP Comprehensive Metabolic Panel 68/08/8108")' href="epic://ordersummary1.2.840.114350.1.13.324.2.7.2.798268^[id number] DUAP Comprehensive Metabolic RPRXY/">58 View Detailed Result Report DUAP COMP.METABOLIC PANEL 59/02/9242")' href="epic://ordersummary1.2.840.114350.1.13.324.2.7.2.798268^[id number] DUAP COMP.METABOLIC QKMMN/">81 View Detailed Result Report DUAP COMP.METABOLIC PANEL 7/71/1657")' href="epic://ordersummary1.2.840.114350.1.13.324.2.7.2.798268^[id number] DUAP COMP.METABOLIC XUXYB/">33 View Detailed Result Report DUAP COMP.METABOLIC PANEL 8/32/9191")' href="epic://ordersummary1.2.840.114350.1.13.324.2.7.2.798268^[id number] DUAP COMP.METABOLIC YOMAY/">04           View Detailed Result Report Comprehensive Metabolic Panel CMP 5/99/7741")' href="epic://ordersummary1.2.840.114350.1.13.324.2.7.2.798268^[id number] Comprehensive Metabolic Panel CMP/">View report to see complete value    GFR:\&#62;60 mL/min/1.73 sq m if Non African-American  GFR:\&#62;60 mL/min/1.73 sq m if African-American   Interpretive Ranges for Patients with Chronic Kidney Disease: ... Specimen: Blood")'> ... View Detailed Result Report Comprehensive Metabolic Panel CMP 42/39/5320")' href="epic://ordersummary1.2.840.114350.1.13.324.2.7.2.798268^[id number] Comprehensive Metabolic Panel CMP/">View report to see complete value    GFR:\&#62;60 mL/min/1.73 sq m if Non African-American  GFR:\&#62;60 mL/min/1.73 sq m if African-American   Interpretive Ranges for Patients with Chronic Kidney  Disease: ...")'> ...    View Detailed Result Report Comprehensive Metabolic Panel 23/34/3568")' href="epic://ordersummary1.2.840.114350.1.13.324.2.7.2.798268^[id number] Comprehensive Metabolic Panel/">View report to see complete value    GFR:\&#62;60 mL/min/1.73 sq m if Non African-American  GFR:\&#62;60 mL/min/1.73 sq m if African-American   Interpretive Ranges for Patients with Chronic Kidney Disease: ...")'> ... View Detailed Result Report Comprehensive  Metabolic Panel 59/56/3875")' href="epic://ordersummary1.2.840.114350.1.13.324.2.7.2.798268^[id number] Comprehensive Metabolic Panel/">View report to see complete value    GFR:\&#62;60 mL/min/1.73 sq m if Non African-American  GFR:\&#62;60 mL/min/1.73 sq m if African-American   Interpretive Ranges for Patients with Chronic Kidney Disease: ...")'> ...   View Detailed Result Report DUAP COMP.METABOLIC PANEL 64/03/3293")' href="epic://ordersummary1.2.840.114350.1.13.324.2.7.2.798268^[id number] DUAP COMP.METABOLIC PANEL/">View report to see complete value    GFR:\&#62;60 mL/min/1.73 sq m if Non African-American  GFR:\&#62;60 mL/min/1.73 sq m if African-American   Interpretive Ranges for Patients with Chronic Kidney Disease: ...")'> ... View Detailed Result Report DUAP COMP.METABOLIC PANEL 1/88/4166")' href="epic://ordersummary1.2.840.114350.1.13.324.2.7.2.798268^[id number] DUAP COMP.METABOLIC PANEL/">View report to see complete value    GFR:\&#62;60 mL/min/1.73 sq m if Non African-American  GFR:\&#62;60 mL/min/1.73 sq m if African-American   Interpretive Ranges for Patients with Chronic Kidney Disease: ...")'> ... View Detailed Result Report DUAP COMP.METABOLIC PANEL 0/63/0160")'  href="epic://ordersummary1.2.840.114350.1.13.324.2.7.2.798268^[id number] DUAP COMP.METABOLIC PANEL/">View report to see complete value    GFR:\&#62;60 mL/min/1.73 sq m if Non African-American  GFR:\&#62;60 mL/min/1.73 sq m if African-American   Interpretive Ranges for Patients with Chronic Kidney Disease: ... Ref. Range: mg/dL ")'> ...             View Detailed Result Report DUAP Comprehensive Metabolic Panel 10/93/2355")' href="epic://ordersummary1.2.840.114350.1.13.324.2.7.2.798268^[id number] DUAP Comprehensive Metabolic DDUKG/">2.5 View Detailed Result Report DUAP Comprehensive Metabolic Panel 05/23/621")' href="epic://ordersummary1.2.840.114350.1.13.324.2.7.2.798268^[id number] DUAP Comprehensive Metabolic Panel/">4 View Detailed Result Report DUAP Comprehensive Metabolic Panel 76/28/3151")' href="epic://ordersummary1.2.840.114350.1.13.324.2.7.2.798268^[id number] DUAP Comprehensive Metabolic VOHYW/">7.3   View Detailed Result Report DUAP Comprehensive Metabolic Panel 08/06/6267")' href="epic://ordersummary1.2.840.114350.1.13.324.2.7.2.798268^[id number] DUAP Comprehensive Metabolic SWNIO/">2.7 View Detailed Result Report DUAP Comprehensive Metabolic Panel 03/50/0938")' href="epic://ordersummary1.2.840.114350.1.13.324.2.7.2.798268^[id number] DUAP Comprehensive Metabolic HWEXH/">3.7                View Detailed Result Report DUAP Comprehensive Metabolic Panel 16/96/7893")' href="epic://ordersummary1.2.840.114350.1.13.324.2.7.2.798268^[id number] DUAP Comprehensive Metabolic YBOFB/">5.1 View Detailed Result Report DUAP Comprehensive Metabolic Panel 0/25/8527")' href="epic://ordersummary1.2.840.114350.1.13.324.2.7.2.798268^[id number] DUAP Comprehensive Metabolic Panel/">1 View Detailed Result Report DUAP Comprehensive Metabolic Panel 78/24/2353")' href="epic://ordersummary1.2.840.114350.1.13.324.2.7.2.798268^[id number] DUAP  Comprehensive Metabolic IRWER/">1.5   View Detailed Result Report DUAP Comprehensive Metabolic Panel 4/00/8676")' href="epic://ordersummary1.2.840.114350.1.13.324.2.7.2.798268^[id number] DUAP Comprehensive Metabolic Panel/">1 View Detailed Result Report DUAP Comprehensive Metabolic Panel 19/50/9326")' href="epic://ordersummary1.2.840.114350.1.13.324.2.7.2.798268^[id number] DUAP Comprehensive Metabolic Panel/">1                View Detailed Result Report DUAP Comprehensive Metabolic Panel 71/24/5809")' href="epic://ordersummary1.2.840.114350.1.13.324.2.7.2.798268^[id number] DUAP Comprehensive Metabolic Panel/">View report to see complete value   GFR:55 ml/min/1.73sq.m if Non African-American  GFR:\&#62;60 mL/min/1.73 sq m if African-American   Interpretive Ranges for Patients with Chronic Kidney Disease: ...")'> ... View Detailed Result Report DUAP Comprehensive Metabolic Panel 9/83/3825")' href="epic://ordersummary1.2.840.114350.1.13.324.2.7.2.798268^[id number] DUAP Comprehensive Metabolic Panel/">View report to see complete value    GFR:\&#62;60 mL/min/1.73 sq m if Non African-American  GFR:\&#62;60 mL/min/1.73 sq m if African-American   Interpretive Ranges for Patients with Chronic Kidney Disease: ...")'> ... View Detailed Result Report DUAP Comprehensive Metabolic Panel 05/39/7673")' href="epic://ordersummary1.2.840.114350.1.13.324.2.7.2.798268^[id number] DUAP Comprehensive Metabolic Panel/">View report to see complete value   GFR:60 ml/min/1.73sq.m if Non African-American  GFR:\&#62;60 mL/min/1.73 sq m if African-American   Interpretive Ranges for Patients with Chronic Kidney Disease: ...")'> ...   View Detailed Result Report DUAP Comprehensive Metabolic Panel 05/15/3788")'  href="epic://ordersummary1.2.840.114350.1.13.324.2.7.2.798268^[id number] DUAP Comprehensive Metabolic Panel/">View report to see complete value    GFR:\&#62;60 mL/min/1.73 sq m if Non African-American  GFR:\&#62;60 mL/min/1.73 sq m if African-American   Interpretive Ranges for Patients with Chronic Kidney Disease: ...")'> ... View Detailed Result Report DUAP Comprehensive Metabolic Panel 24/09/7351")' href="epic://ordersummary1.2.840.114350.1.13.324.2.7.2.798268^[id number] DUAP Comprehensive Metabolic Panel/">View report to see complete value    GFR:\&#62;60 mL/min/1.73 sq m if Non African-American  GFR:\&#62;60 mL/min/1.73 sq m if African-American   Interpretive Ranges for Patients with Chronic Kidney Disease: ...")'> .Marland KitchenMarland Kitchen  View Detailed Result Report DUAP Comprehensive Metabolic Panel 62/37/6283")' href="epic://ordersummary1.2.840.114350.1.13.324.2.7.2.798268^[id number] DUAP Comprehensive Metabolic TDVVO/">16 View Detailed Result Report DUAP Comprehensive Metabolic Panel 0/73/7106")' href="epic://ordersummary1.2.840.114350.1.13.324.2.7.2.798268^[id number] DUAP Comprehensive Metabolic YIRSW/">54 View Detailed Result Report DUAP Comprehensive Metabolic Panel 62/70/3500")' href="epic://ordersummary1.2.840.114350.1.13.324.2.7.2.798268^[id number] DUAP Comprehensive Metabolic XFGHW/">299   View Detailed Result Report DUAP Comprehensive Metabolic Panel 3/71/6967")' href="epic://ordersummary1.2.840.114350.1.13.324.2.7.2.798268^[id number] DUAP Comprehensive Metabolic ELFYB/">01 View Detailed Result Report DUAP Comprehensive Metabolic Panel 75/10/2583")' href="epic://ordersummary1.2.840.114350.1.13.324.2.7.2.798268^[id number] DUAP Comprehensive Metabolic IDPOE/">42              View Detailed Result Report Comprehensive Metabolic Panel CMP 3/53/6144")'  href="epic://ordersummary1.2.840.114350.1.13.324.2.7.2.798268^[id number] Comprehensive Metabolic Panel CMP/"> Specimen: Blood")'>19 View Detailed Result Report Comprehensive Metabolic Panel CMP 31/54/0086")' href="epic://ordersummary1.2.840.114350.1.13.324.2.7.2.798268^[id number] Comprehensive Metabolic Panel PYP/">95    View Detailed Result Report Comprehensive Metabolic Panel 09/32/6712")' href="epic://ordersummary1.2.840.114350.1.13.324.2.7.2.798268^[id number] Comprehensive Metabolic WPYKD/">98 View Detailed Result Report Comprehensive Metabolic Panel 33/82/5053")' href="epic://ordersummary1.2.840.114350.1.13.324.2.7.2.798268^[id number] Comprehensive Metabolic ZJQBH/">41           View Detailed Result Report Comprehensive Metabolic Panel CMP 93/79/0240")' href="[email or web address]^[id number] Comprehensive Metabolic Panel CMP/"> Specimen: Blood")'>26 View Detailed Result Report Comprehensive Metabolic Panel CMP 9/73/5329")' href="[email or web address]^[id number] Comprehensive Metabolic Panel CMP/"> Specimen: Blood")'>25 View Detailed Result Report Basic Metabolic Panel BMP 09/29/4266")' href="[id number] Basic Metabolic Panel BMP/"> Specimen: Blood")'>25 View Detailed Result Report Basic Metabolic Panel BMP 9/70/2637")' href="[id number] Basic Metabolic Panel BMP/"> Specimen: Blood")'>27 View Detailed Result Report Comprehensive Metabolic Panel CMP 05/03/6544")' href="[email or web address]^[id number] Comprehensive Metabolic Panel CMP/"> Specimen: Blood")'>25                  View Detailed Result Report Comprehensive Metabolic Panel CMP 50/35/4656")' href="[id number] Comprehensive Metabolic  Panel CMP/"> Specimen: Blood")'>7 View Detailed Result Report Comprehensive Metabolic Panel CMP 09/08/7515")' href="[id number] Comprehensive Metabolic Panel CMP/"> Specimen: Blood")'>10 View Detailed Result Report Basic Metabolic Panel BMP 0/0/1749")' href="epic://[id number] Basic Metabolic Panel BMP/"> Specimen: Blood")'>11 View Detailed Result Report Basic Metabolic Panel BMP 3/00/7622")' href="[email or web address]://[id number] Basic Metabolic Panel BMP/"> Specimen: Blood")'>9 View Detailed Result Report Comprehensive Metabolic Panel CMP 10/03/4161")' href="[email or web address]^[id number] Comprehensive Metabolic Panel CMP/"> Specimen: Blood")'>10                  View Detailed Result Report Comprehensive Metabolic Panel CMP 84/53/6468")' href="[email or web address]^[id number] Comprehensive Metabolic Panel CMP/"> Specimen: Blood")'>22 View Detailed Result Report Comprehensive Metabolic Panel CMP 0/32/1224")' href="[email or web address]://[email or web address]^[id number] Comprehensive Metabolic Panel CMP/"> Specimen: Blood")'>23   View Detailed Result Report Comprehensive Metabolic Panel CMP 08/28/5001")' href="[email or web address]://[email or web address]^[id number] Comprehensive Metabolic Panel CMP/"> Specimen: Blood")'>30                  View Detailed Result Report Comprehensive Metabolic Panel CMP 70/48/8891")' href="[email or web address]://[email or web address]^[id number] Comprehensive Metabolic Panel CMP/"> Specimen: Blood")'>75 View Detailed Result Report Comprehensive Metabolic Panel CMP 6/94/5038")' href="epic://[email or web address]^[id number] Comprehensive Metabolic Panel  CMP/"> Specimen: Blood")'>86   View Detailed Result Report Comprehensive Metabolic Panel CMP 09/04/2798")' href="[email or web address]://[email or web address]^[id number] Comprehensive Metabolic Panel CMP/"> Specimen: Blood")'>80                  View Detailed Result Report Comprehensive Metabolic Panel CMP 34/91/7915")' href="[email or web address]://[email or web address]^[id number] Comprehensive Metabolic Panel CMP/"> Specimen: Blood")'>18 View Detailed Result Report Comprehensive Metabolic Panel CMP 0/56/9794")' href="epic://[email or web address]^[id number] Comprehensive Metabolic Panel CMP/"> Specimen: Blood")'>21   View Detailed Result Report Comprehensive Metabolic Panel CMP 8/0/1655")' href="epic://[email or web address]^[id number] Comprehensive Metabolic Panel CMP/"> Specimen: Blood")'>28                  View Detailed Result Report Comprehensive Metabolic Panel CMP 37/48/2707")' href="epic://[email or web address]^[id number] Comprehensive Metabolic Panel CMP/"> Specimen: Blood")'>>60 View Detailed Result Report Comprehensive Metabolic Panel CMP 8/67/5449")' href="epic://[email or web address]^[id number] Comprehensive Metabolic Panel CMP/"> Specimen: Blood")'>>60 View Detailed Result Report Basic Metabolic Panel BMP 2/0/1007")' href="epic://[email or web address].1.5.830940^[id number] Basic Metabolic Panel BMP/"> Specimen: Blood")'>>60 View Detailed Result Report Basic Metabolic Panel BMP 04/10/9456")' href="epic://[email or web address].1.6.579038^[id number] Basic Metabolic Panel BMP/"> Specimen: Blood")'>>60 View Detailed Result Report Comprehensive Metabolic Panel CMP 02/01/6058")' href="epic://[email or web address]^[id number] Comprehensive Metabolic Panel  CMP/"> Specimen: Blood")'>>60  View Detailed Result Report Comprehensive Metabolic Panel CMP 33/29/5188")' href="epic://[email or web address]^[id number] Comprehensive Metabolic Panel CMP/"> Specimen: Blood")'>7 View Detailed Result Report Comprehensive Metabolic Panel CMP 05/12/6061")' href="epic://[email or web address]^[id number] Comprehensive Metabolic Panel CMP/"> Specimen: Blood")'>9 View Detailed Result Report Basic Metabolic Panel BMP 0/01/6008")' href="epic://ordersummary1.9.323.557322.0.25.427.0.6.2.376283^[id number] Basic Metabolic Panel BMP/"> Specimen: Blood")'>11 View Detailed Result Report Basic Metabolic Panel BMP 3/71/0626")' href="epic://ordersummary1.9.485.462703.5.00.938.1.8.2.993716^[id number] Basic Metabolic Panel BMP/"> Specimen: Blood")'>9 View Detailed Result Report Comprehensive Metabolic Panel CMP 02/02/5100")' href="epic://[email or web address].7.2.798268^[id number] Comprehensive Metabolic Panel CMP/"> Specimen: Blood")'>10                    View Detailed Result Report Basic Metabolic Panel BMP 06/04/5275")' href="epic://ordersummary1.8.242.353614.4.31.540.0.8.6.761950^[id number] Basic Metabolic Panel BMP/"> Specimen: Blood")'>14 View Detailed Result Report Basic Metabolic Panel BMP 09/05/9831")' href="epic://ordersummary1.8.250.539767.3.41.937.9.0.2.409735^[id number] Basic Metabolic Panel BMP/"> Specimen: Blood")'>12 View Detailed Result Report Comprehensive Metabolic Panel CMP 03/30/1960")' href="epic://[email or web address].2.798268^[id number] Comprehensive Metabolic Panel CMP/"> Specimen: Blood")'>15                  View Detailed Result Report Comprehensive Metabolic Panel CMP 22/97/9892")' href="epic://[email or web address].2.798268^[id number] Comprehensive Metabolic Panel  CMP/"> Specimen: Blood")'>9 View Detailed Result Report Comprehensive Metabolic Panel CMP 02/15/4172")' href="epic://[email or web address]^[id number] Comprehensive Metabolic Panel CMP/"> Specimen: Blood")'>10                                      View Detailed Result Report CHEM PROFILE 11/25/1999")' href="epic://[email or web address]^[id number] CHEM PROFILE/"> View Detailed Result Report CHEM PROFILE 11/25/1999")' href="epic://[email or web address]^[id number] CHEM PROFILE/"> View Detailed Result Report CHEM PROFILE 10/24/1993")' href="epic://[email or web address].2.798268^[id number] CHEM PROFILE/"> View Detailed Result Report CHEM PROFILE 10/24/1993")' href="epic://[email or web address]^[id number] CHEM PROFILE/">  Sodium  Potassium  Chloride  Carbon Dioxide  Urea Nitrogen  Creatinine  Calcium  Protein, Total  Albumin  Bilirubin, Total  Alkaline Phosphatase  DUAP AST  ALT  Glucose  GFR(MDRD)  DUAP Potassium  DUAP CREATININE  DUAP GFR(MDRD)  DUAP Alkaline Phosphatase  AST  Carbon Dioxide (CO2)  Urea Nitrogen (BUN)  AST (Aspartate Aminotransferase)  Alk Phos (Alkaline Phosphatase)  ALT (Alanine Aminotransferase)  Glomerular Filtration Rate (eGFR), MDRD Estimate  BUN/CREA Ratio  Anion Gap (With     [place]")' href="epic://[id number]/">11/14/2015  Office Visit on 03/13/2015 [place]")' href="epic://[id number]/">03/13/2015  Office Visit on 02/02/2013 [place]")' href="[id number]/">02/02/2013  Office Visit on 10/25/2012 [place]")'  href="[email or web address]/">10/25/2012    Specimen: Blood")'>16.5  Specimen: Blood")'>16.3  Specimen: Blood")'>16.5 16.6   Specimen: Blood")'>47.4  Specimen: Blood")'>49.4 (H)  Specimen: Blood")'>0.47 0.46     4.73     35.0 (H)     35.7     13.4     98     244     7.5     5.7   Specimen: Blood")'>6.0  Specimen: Blood")'>6.3  Specimen: Blood")'>5.0    Specimen: Blood")'>4.80  Specimen: Blood")'>5.01  Specimen: Blood")'>4.80    Specimen: Blood")'>99 (H)  Specimen: Blood")'>99 (H)  Specimen: Blood")'>98    Specimen: Blood")'>34.4 (H)  Specimen: Blood")'>32.5  Specimen: Blood")'>34.3 (H)    Specimen: Blood")'>34.8  Specimen: Blood")'>33.0  Specimen: Blood")'>35.1    Specimen: Blood")'>278  Specimen: Blood")'>258  Specimen: Blood")'>255    Specimen: Blood")'>13.3  Specimen: Blood")'>13.3  Specimen: Blood")'>13.2    Specimen: Blood")'>10.4  Specimen: Blood")'>9.7  Specimen: Blood")'>7.6   Hemoglobin  Hematocrit  Red Blood Cell Count  MCH  MCHC  RDW-CV  MCV  Platelet Count /L  MPV  White Blood Cell Count  WBC (White Blood Cell Count)  RBC (Red Blood Cell Count)  MCV (Mean Corpuscular Volume)  MCH (Mean Corpuscular Hemoglobin)  MCHC (Mean Corpuscular Hemoglobin Concentration)  Plt (platelets)  RDW-CV (Red Cell Distribution Width)  MPV (Mean Platelet Volume)

## 2015-11-21 NOTE — Pre-Procedure Instructions (Signed)
Labs/ekg on chart in pcp note

## 2015-11-21 NOTE — Patient Instructions (Signed)
  Your procedure is scheduled on:11/22/15 Report to Day Surgery. [facility] second floor To find out your arrival time please call [phone] between 1PM - 3PM on 11/21/15.  Remember: Instructions that are not followed completely may result in serious medical risk, up to and including death, or upon the discretion of your surgeon and anesthesiologist your surgery may need to be rescheduled.    _x___ 1. Do not eat food or drink liquids after midnight. No gum chewing or hard candies.     _x___ 2. No Alcohol for 24 hours before or after surgery.   _x___ 3. Do Not Smoke For 24 Hours Prior to Your Surgery.   ____ 4. Bring all medications with you on the day of surgery if instructed.    __x__ 5. Notify your doctor if there is any change in your medical condition     (cold, fever, infections).       Do not wear jewelry, make-up, hairpins, clips or nail polish.  Do not wear lotions, powders, or perfumes. You may wear deodorant.  Do not shave 48 hours prior to surgery. Men may shave face and neck.  Do not bring valuables to the hospital.    [facility] is not responsible for any belongings or valuables.               Contacts, dentures or bridgework may not be worn into surgery.  Leave your suitcase in the car. After surgery it may be brought to your room.  For patients admitted to the hospital, discharge time is determined by your                treatment team.   Patients discharged the day of surgery will not be allowed to drive home.      _x___ Take these medicines the morning of surgery with A SIP OF WATER:    1.clonazepam  2. amlodipine  3. Omeprazole at bedtime 11/21/15 and am surgery  4.  5.  6.  ____ Fleet Enema (as directed)   ____ Use CHG Soap as directed  ____ Use inhalers on the day of surgery  ____ Stop metformin 2 days prior to surgery    ____ Take 1/2 of usual insulin dose the night before surgery and none on the morning of surgery.   ____ Stop  Coumadin/Plavix/aspirin on    Continuing to take xarelto as he was instructed by surgeon  ____ Stop Anti-inflammatories on    ____ Stop supplements until after surgery.    ____ Bring C-Pap to the hospital.

## 2015-11-22 ENCOUNTER — Ambulatory Visit
Admission: RE | Admit: 2015-11-22 | Discharge: 2015-11-22 | Disposition: A | Discharge status: Home / Self Care | Payer: Medicare Other | Source: Ambulatory Visit | Attending: Otolaryngology | Admitting: Otolaryngology

## 2015-11-22 ENCOUNTER — Encounter
Admission: RE | Disposition: A | Discharge status: Home / Self Care | Payer: Self-pay | Source: Ambulatory Visit | Attending: Otolaryngology

## 2015-11-22 ENCOUNTER — Encounter: Payer: Self-pay | Admitting: *Deleted

## 2015-11-22 ENCOUNTER — Ambulatory Visit: Payer: Medicare Other | Admitting: Anesthesiology

## 2015-11-22 DIAGNOSIS — H6992 Unspecified Eustachian tube disorder, left ear: Secondary | ICD-10-CM | POA: Diagnosis present

## 2015-11-22 DIAGNOSIS — K219 Gastro-esophageal reflux disease without esophagitis: Secondary | ICD-10-CM | POA: Insufficient documentation

## 2015-11-22 DIAGNOSIS — I10 Essential (primary) hypertension: Secondary | ICD-10-CM | POA: Insufficient documentation

## 2015-11-22 DIAGNOSIS — Z87891 Personal history of nicotine dependence: Secondary | ICD-10-CM | POA: Insufficient documentation

## 2015-11-22 DIAGNOSIS — H9212 Otorrhea, left ear: Secondary | ICD-10-CM | POA: Diagnosis not present

## 2015-11-22 DIAGNOSIS — Z79899 Other long term (current) drug therapy: Secondary | ICD-10-CM | POA: Insufficient documentation

## 2015-11-22 DIAGNOSIS — Z7901 Long term (current) use of anticoagulants: Secondary | ICD-10-CM | POA: Insufficient documentation

## 2015-11-22 HISTORY — PX: MYRINGOTOMY WITH TUBE PLACEMENT: SHX5663

## 2015-11-22 SURGERY — MYRINGOTOMY WITH TUBE PLACEMENT
Anesthesia: General | Laterality: Left

## 2015-11-22 MED ORDER — LACTATED RINGERS IV SOLN
INTRAVENOUS | Status: DC
  Administered 2015-11-22: 07:00:00 via INTRAVENOUS

## 2015-11-22 MED ORDER — ONDANSETRON HCL 4 MG/2ML IJ SOLN: 4.0000 mg | Freq: Once | INTRAMUSCULAR | Status: DC | PRN

## 2015-11-22 MED ORDER — OFLOXACIN 0.3 % OT SOLN: 5.0000 [drp] | Freq: Every day | OTIC | 0 refills | Status: AC

## 2015-11-22 MED ORDER — CIPROFLOXACIN-DEXAMETHASONE 0.3-0.1 % OT SUSP
OTIC | Status: DC | PRN
  Administered 2015-11-22: 4 [drp] via OTIC

## 2015-11-22 MED ORDER — ACETAMINOPHEN 325 MG PO TABS
ORAL_TABLET | ORAL | Status: AC
  Filled 2015-11-22: qty 2

## 2015-11-22 MED ORDER — PROPOFOL 10 MG/ML IV BOLUS
INTRAVENOUS | Status: DC | PRN
  Administered 2015-11-22: 100 mg via INTRAVENOUS

## 2015-11-22 MED ORDER — MIDAZOLAM HCL 2 MG/2ML IJ SOLN
INTRAMUSCULAR | Status: DC | PRN
  Administered 2015-11-22: 2 mg via INTRAVENOUS

## 2015-11-22 MED ORDER — FENTANYL CITRATE (PF) 100 MCG/2ML IJ SOLN
INTRAMUSCULAR | Status: DC | PRN
  Administered 2015-11-22 (×2): 25 ug via INTRAVENOUS

## 2015-11-22 MED ORDER — LIDOCAINE HCL (CARDIAC) 20 MG/ML IV SOLN
INTRAVENOUS | Status: DC | PRN
  Administered 2015-11-22: 80 mg via INTRAVENOUS

## 2015-11-22 MED ORDER — ACETAMINOPHEN 325 MG PO TABS
650.0000 mg | ORAL_TABLET | Freq: Once | ORAL | Status: AC
  Administered 2015-11-22: 650 mg via ORAL

## 2015-11-22 MED ORDER — GLYCOPYRROLATE 0.2 MG/ML IJ SOLN
INTRAMUSCULAR | Status: DC | PRN
Start: 2015-11-22 — End: 2015-11-22
  Administered 2015-11-22: 0.2 mg via INTRAVENOUS

## 2015-11-22 SURGICAL SUPPLY — 6 items
CANISTER SUCT 1200ML W/VALVE (MISCELLANEOUS) ×3 IMPLANT
GLOVE BIO SURGEON STRL SZ7.5 (GLOVE) ×3 IMPLANT
TOWEL OR 17X26 4PK STRL BLUE (TOWEL DISPOSABLE) ×3 IMPLANT
TUBE EAR T 1.27X5.3 BFLY (OTOLOGIC RELATED) ×3 IMPLANT
TUBING CONNECTING 10 (TUBING) ×2 IMPLANT
TUBING CONNECTING 10' (TUBING) ×1

## 2015-11-22 NOTE — Op Note (Signed)
..  11/22/2015  8:33 AM    Elijah Lopez  ZI:[id number]   Pre-Op Dx:  Abner Greenspan TUBE DYSFUNCTION  Post-op Dx: Abner Greenspan TUBE DYSFUNCTION  Proc:Left myringotomy with tube with Butterfly tube  Surg: Elijah Lopez  Anes:  General by mask  EBL:  None  Comp:  None  Findings:  Extruded tube with blood attached to ear drum, butterfly tube placed in anterior-inferior position.  Procedure: With the patient in a comfortable supine position, general mask anesthesia was administered.  At an appropriate level, microscope and speculum were used to examine and clean the LEFT ear canal.  An extruded tube with dried blood attached to the tympanic membrane was removed from the ear canal with alligator forceps.  This resulted in a small amount of oozing from the lateral aspect of the tympanic membrane. An anterior inferior radial myringotomy incision was sharply executed.  Middle ear contents were suctioned clear with a size 5 otologic suction.  A Butterfly type PE tube was placed without difficulty using a [employer] assistant, radio.  Ciprodex otic solution was instilled into the external canal, and insufflated into the middle ear.  A cotton ball was placed at the external meatus. Hemostasis was observed.  This side was completed.  Following this  The patient was returned to anesthesia, awakened, and transferred to recovery in stable condition.  Dispo:  PACU to home  Plan: Routine drop use and water precautions.  Recheck my office three weeks.   [name] 8:33 AM 11/22/2015

## 2015-11-22 NOTE — H&P (Signed)
..  History and Physical paper copy reviewed and updated date of procedure and will be scanned into system.  

## 2015-11-22 NOTE — Discharge Instructions (Signed)
AMBULATORY SURGERY  °DISCHARGE INSTRUCTIONS ° ° °1) The drugs that you were given will stay in your system until tomorrow so for the next 24 hours you should not: ° °A) Drive an automobile °B) Make any legal decisions °C) Drink any alcoholic beverage ° ° °2) You may resume regular meals tomorrow.  Today it is better to start with liquids and gradually work up to solid foods. ° °You may eat anything you prefer, but it is better to start with liquids, then soup and crackers, and gradually work up to solid foods. ° ° °3) Please notify your doctor immediately if you have any unusual bleeding, trouble breathing, redness and pain at the surgery site, drainage, fever, or pain not relieved by medication. ° ° ° °4) Additional Instructions: ° ° ° ° ° ° ° °Please contact your physician with any problems or Same Day Surgery at [phone], Monday through Friday 6 am to 4 pm, or [place] at [place] number at [phone]. °

## 2015-11-22 NOTE — Transfer of Care (Signed)
Immediate Anesthesia Transfer of Care Note  Patient: Elijah Lopez.  Procedure(s) Performed: Procedure(s): MYRINGOTOMY WITH TUBE PLACEMENT (Left)  Patient Location: PACU  Anesthesia Type:General  Level of Consciousness: sedated  Airway & Oxygen Therapy: Patient Spontanous Breathing and Patient connected to face mask oxygen  Post-op Assessment: Report given to RN and Post -op Vital signs reviewed and stable  Post vital signs: Reviewed and stable  Last Vitals:  Vitals:   11/22/15 0840 11/22/15 0841  BP: 114/79 114/79  Pulse: 90 90  Resp: 14 14  Temp: (!) 36 C 123XX123 C    Complications: No apparent anesthesia complications

## 2015-11-22 NOTE — Anesthesia Preprocedure Evaluation (Signed)
Anesthesia Evaluation  Patient identified by MRN, date of birth, ID band Patient awake    Reviewed: Allergy & Precautions, NPO status , Patient's Chart, lab work & pertinent test results  Airway Mallampati: III  TM Distance: <3 FB     Dental  (+) Chipped   Pulmonary Current Smoker,    Pulmonary exam normal        Cardiovascular hypertension, Pt. on medications Normal cardiovascular exam     Neuro/Psych Anxiety tinnitus    GI/Hepatic Neg liver ROS, GERD  Medicated and Controlled,  Endo/Other  negative endocrine ROS  Renal/GU negative Renal ROSstones   BPH    Musculoskeletal   Abdominal Normal abdominal exam  (+)   Peds negative pediatric ROS (+)  Hematology  (+) Blood dyscrasia, ,   Anesthesia Other Findings Past Medical History: No date: Amnesia, global, transient No date: Anxiety No date: Blood dyscrasia No date: BPH (benign prostatic hyperplasia) No date: Cancer ([place])     Comment: skin No date: DVT (deep venous thrombosis) (HCC) No date: Factor 5 Leiden mutation, 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[place]) No date: Hematuria No date: History of kidney stones No date: Hypertension No date: Monoclonal gammopathy 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 * No date: Monoclonal paraproteinemia No date: Tinnitus  Reproductive/Obstetrics                             Anesthesia Physical Anesthesia Plan  ASA: III  Anesthesia Plan: General   Post-op Pain Management:    Induction: Intravenous  Airway Management Planned: LMA and Mask  Additional Equipment:   Intra-op Plan:   Post-operative Plan: Extubation in OR  Informed Consent: I have reviewed the patients History and Physical, chart, labs and discussed the procedure including the risks, benefits and alternatives for the proposed anesthesia with the patient or authorized representative who has indicated his/her understanding and  acceptance.   Dental advisory given  Plan Discussed with: CRNA and Surgeon  Anesthesia Plan Comments:         Anesthesia Quick Evaluation

## 2015-11-22 NOTE — Anesthesia Postprocedure Evaluation (Signed)
Anesthesia Post Note  Patient: Elijah Lopez.  Procedure(s) Performed: Procedure(s) (LRB): MYRINGOTOMY WITH TUBE PLACEMENT (Left)  Patient location during evaluation: PACU Anesthesia Type: General Level of consciousness: awake and alert and oriented Pain management: pain level controlled Vital Signs Assessment: post-procedure vital signs reviewed and stable Respiratory status: spontaneous breathing Cardiovascular status: blood pressure returned to baseline Anesthetic complications: no    Last Vitals:  Vitals:   11/22/15 0930 11/22/15 1045  BP: 111/86 121/78  Pulse: 90 86  Resp: 16 16  Temp: 36.3 C     Last Pain:  Vitals:   11/22/15 1045  TempSrc:   PainSc: 2                  Torrence Hammack

## 2016-04-10 ENCOUNTER — Other Ambulatory Visit: Payer: Self-pay

## 2016-04-10 ENCOUNTER — Encounter: Payer: Self-pay | Admitting: Gastroenterology

## 2016-04-10 ENCOUNTER — Ambulatory Visit: Payer: Medicare Other | Admitting: Gastroenterology

## 2016-04-10 ENCOUNTER — Ambulatory Visit (INDEPENDENT_AMBULATORY_CARE_PROVIDER_SITE_OTHER): Payer: Medicare Other | Admitting: Gastroenterology

## 2016-04-10 VITALS — BP 132/74 | HR 81 | Temp 98.4°F | Ht 70.5 in | Wt 192.0 lb

## 2016-04-10 DIAGNOSIS — R197 Diarrhea, unspecified: Secondary | ICD-10-CM | POA: Diagnosis not present

## 2016-04-10 DIAGNOSIS — E785 Hyperlipidemia, unspecified: Secondary | ICD-10-CM | POA: Insufficient documentation

## 2016-04-10 MED ORDER — CHOLESTYRAMINE 4 G PO PACK: 4.0000 g | PACK | Freq: Three times a day (TID) | ORAL | 12 refills | Status: DC

## 2016-04-10 NOTE — Progress Notes (Signed)
Gastroenterology Consultation  Referring Provider:     Crummett, Perry Mount, NP Primary Care Physician:  Rexene Edison, NP Primary Gastroenterologist:  Dr. Shearman Norris     Reason for Consultation:     Diarrhea        HPI:   Elijah Lopez. is a 76 y.o. y/o male referred for consultation & management of Diarrhea by Dr. Rexene Edison, NP.  This patient comes today with a history of diarrhea after having his gallbladder out. The patient was told that he should have diarrhea for a couple of months after his gallbladder comes out but it has been almost a year. The patient also reports that he had a colonoscopy back in 2012 was normal. The patient does not have a history of colon polyps or colon cancer. The patient is concerned that he may need a repeat colonoscopy despite not having any problems at the present time except the diarrhea. The patient has a history of a colectomy due to severe diverticulitis.  Past Medical History:  Diagnosis Date  . Amnesia, global, transient   . Anxiety   . Blood dyscrasia   . BPH (benign prostatic hyperplasia)   . Cancer ([place])    skin  . DVT (deep venous thrombosis) ([place])   . Factor 5 Leiden mutation, 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[place])   . Hematuria   . History of kidney stones   . Hypertension   . Monoclonal gammopathy   . Monoclonal gammopathy of unknown significance ([facility])   . Monoclonal paraproteinemia   . Tinnitus     Past Surgical History:  Procedure Laterality Date  . APPENDECTOMY    . CHOLECYSTECTOMY     6/17  . COLON SURGERY    . ELBOW SURGERY    . HAND SURGERY    . INNER EAR SURGERY Right    prosthetic  . IVC FILTER PLACEMENT ([facility] HX)    . IVC FILTER PLACEMENT ([facility]X)     retrivial  . MYRINGOTOMY WITH TUBE PLACEMENT Left 11/22/2015   Procedure: MYRINGOTOMY WITH TUBE PLACEMENT;  Surgeon: Carloyn Manner, MD;  Location: [facility] ORS;  Service: ENT;  Laterality: Left;  . SHOULDER SURGERY Left    has broken pin    Prior to Admission  medications   Medication Sig Start Date End Date Taking? Authorizing Provider  Acetaminophen (TYLENOL 8 HOUR PO) Take by mouth. As needed   Yes Historical Provider, MD  amLODipine (NORVASC) 5 MG tablet Take 5 mg by mouth daily. 1/2 tab am and pm   Yes Historical Provider, MD  Cholecalciferol (VITAMIN D3) 1000 units CAPS Take by mouth.   Yes Historical Provider, MD  clonazePAM (KLONOPIN) 1 MG tablet Take 1 mg by mouth 3 (three) times daily.   Yes Historical Provider, MD  docusate sodium (STOOL SOFTENER) 100 MG capsule Take 100 mg by mouth. 07/23/11  Yes Historical Provider, MD  lisinopril (PRINIVIL,ZESTRIL) 5 MG tablet Take 7.5 mg by mouth daily.   Yes Historical Provider, MD  omeprazole (PRILOSEC OTC) 20 MG tablet Take by mouth. 04/04/13  Yes Historical Provider, MD  omeprazole (PRILOSEC) 20 MG capsule Take 20 mg by mouth daily. As needed   Yes Historical Provider, MD  rivaroxaban (XARELTO) 10 MG TABS tablet Take 10 mg by mouth daily. pm   Yes Historical Provider, MD  sodium fluoride (DENTAGEL) 1.1 % GEL dental gel  04/17/13  Yes Historical Provider, MD  zolpidem (AMBIEN) 10 MG tablet Take 10 mg by mouth at bedtime as  needed for sleep.   Yes Historical Provider, MD  cholestyramine (QUESTRAN) 4 g packet Take 1 packet (4 g total) by mouth 3 (three) times daily with meals. 04/10/16   Lucilla Lame, MD    History reviewed. No pertinent family history.   Social History  Substance Use Topics  . Smoking status: Current Some Day Smoker    Types: Cigars  . Smokeless tobacco: Never Used  . Alcohol use Yes     Comment: rare    Allergies as of 04/10/2016 - Review Complete 11/22/2015  Allergen Reaction Noted  . Ceftin [cefuroxime axetil] Swelling 08/25/2012  . Trazodone and nefazodone Anaphylaxis 11/21/2015  . Eryc [erythromycin] Other (See Comments) 11/21/2015  . Sulfamethoxazole Hives 03/18/2016  . Dilaudid [hydromorphone hcl] Nausea And Vomiting 11/21/2015  . Elavil [amitriptyline hcl] Nausea And  Vomiting 11/21/2015  . Hctz [hydrochlorothiazide]  08/25/2012  . Percodan [oxycodone-aspirin] Nausea And Vomiting 11/21/2015  . Piroxicam Other (See Comments) 08/25/2012  . Promethazine Other (See Comments) 08/25/2012  . Simvastatin Other (See Comments) 11/21/2015  . Triamterene Other (See Comments) 08/25/2012  . Tussionex pennkinetic er [hydrocod polst-cpm polst er] Nausea And Vomiting 11/21/2015  . Amitriptyline Nausea And Vomiting 08/25/2012  . Amoxapine and related Rash 11/21/2015  . Amoxicillin Rash 08/25/2012  . Ativan [lorazepam] Nausea And Vomiting 08/25/2012  . Codeine Nausea And Vomiting 08/25/2012  . Erythromycin base Nausea And Vomiting and Rash 08/25/2012  . Gabapentin Nausea And Vomiting 08/25/2012  . Hydromorphone Nausea And Vomiting 08/25/2012  . Lecithin Nausea And Vomiting 08/25/2012  . Naproxen Nausea And Vomiting and Rash 11/21/2015  . Naproxen sodium Nausea And Vomiting and Rash 08/25/2012  . Nortriptyline Nausea And Vomiting 08/25/2012  . Nsaids Nausea And Vomiting and Rash 08/25/2012  . Orudis [ketoprofen] Rash 07/17/2015  . Oxycodone-acetaminophen Nausea And Vomiting and Palpitations 08/25/2012  . Sertraline Anxiety 08/25/2012  . Sonata [zaleplon] Anxiety 08/25/2012  . Zoloft [sertraline hcl] Anxiety 11/21/2015    Review of Systems:    All systems reviewed and negative except where noted in HPI.   Physical Exam:  BP 132/74   Pulse 81   Temp 98.4 F (36.9 C) (Oral)   Ht 5' 10.5" (1.791 m)   Wt 192 lb (87.1 kg)   BMI 27.16 kg/m  No LMP for male patient. Psych:  Alert and cooperative. Normal mood and affect. General:   Alert,  Well-developed, well-nourished, pleasant and cooperative in NAD Head:  Normocephalic and atraumatic. Eyes:  Sclera clear, no icterus.   Conjunctiva pink. Ears:  Normal auditory acuity. Nose:  No deformity, discharge, or lesions. Mouth:  No deformity or lesions,oropharynx pink & moist. Neck:  Supple; no masses or  thyromegaly. Lungs:  Respirations even and unlabored.  Clear throughout to auscultation.   No wheezes, crackles, or rhonchi. No acute distress. Heart:  Regular rate and rhythm; no murmurs, clicks, rubs, or gallops. Abdomen:  Normal bowel sounds.  No bruits.  Soft, non-tender and non-distended without masses, hepatosplenomegaly or hernias noted.  No guarding or rebound tenderness.  Negative Carnett sign.   Rectal:  Deferred.  Msk:  Symmetrical without gross deformities.  Good, equal movement & strength bilaterally. Pulses:  Normal pulses noted. Extremities:  No clubbing or edema.  No cyanosis. Neurologic:  Alert and oriented x3;  grossly normal neurologically. Skin:  Intact without significant lesions or rashes.  No jaundice. Lymph Nodes:  No significant cervical adenopathy. Psych:  Alert and cooperative. Normal mood and affect.  Imaging Studies: No results found.  Assessment and  Plan:   Elijah Lopez. is a 76 y.o. y/o male who comes in today with a history of postcholecystectomy diarrhea and questioning whether he needs a colonoscopy or not. The patient has been told that since his colonoscopy in 2012 was normal he does not need a colonoscopy at this time. The patient has also been told that we will start him on Questran for his bilious diarrhea. He will start at one time a day and advance as needed up until 3 times a day for his diarrhea. The patient wants to start off slowly and step up because she is scared of having constipation. He should has been given a prescription for Questran and will follow up as needed.    Lucilla Lame, MD. Marval Regal   Note: This dictation was prepared with Dragon dictation along with smaller phrase technology. Any transcriptional errors that result from this process are unintentional.

## 2018-07-16 ENCOUNTER — Other Ambulatory Visit: Payer: Self-pay | Admitting: Orthopaedic Surgery

## 2018-07-16 ENCOUNTER — Ambulatory Visit: Payer: Medicare Other

## 2018-07-16 DIAGNOSIS — M5136 Other intervertebral disc degeneration, lumbar region: Secondary | ICD-10-CM

## 2018-07-22 ENCOUNTER — Ambulatory Visit: Payer: Medicare Other

## 2018-07-23 NOTE — Progress Notes (Signed)
Called patient to review arrival time for myelogram and went over instructions, patient has already stopped his xarelto on 6/25. Informed patient to arrive at 7am.

## 2018-07-26 ENCOUNTER — Other Ambulatory Visit: Payer: Self-pay

## 2018-07-26 ENCOUNTER — Ambulatory Visit
Admission: RE | Admit: 2018-07-26 | Discharge: 2018-07-26 | Disposition: A | Discharge status: Home / Self Care | Payer: Medicare Other | Source: Ambulatory Visit | Attending: Orthopaedic Surgery | Admitting: Orthopaedic Surgery

## 2018-07-26 DIAGNOSIS — I723 Aneurysm of iliac artery: Secondary | ICD-10-CM | POA: Insufficient documentation

## 2018-07-26 DIAGNOSIS — Z7901 Long term (current) use of anticoagulants: Secondary | ICD-10-CM | POA: Insufficient documentation

## 2018-07-26 DIAGNOSIS — M5136 Other intervertebral disc degeneration, lumbar region: Secondary | ICD-10-CM | POA: Insufficient documentation

## 2018-07-26 DIAGNOSIS — M48061 Spinal stenosis, lumbar region without neurogenic claudication: Secondary | ICD-10-CM | POA: Diagnosis not present

## 2018-07-26 LAB — CBC
HCT: 47.4 % (ref 39.0–52.0)
Hemoglobin: 15.9 g/dL (ref 13.0–17.0)
MCH: 33 pg (ref 26.0–34.0)
MCHC: 33.5 g/dL (ref 30.0–36.0)
MCV: 98.3 fL (ref 80.0–100.0)
Platelets: 244 10*3/uL (ref 150–400)
RBC: 4.82 MIL/uL (ref 4.22–5.81)
RDW: 12.8 % (ref 11.5–15.5)
WBC: 5.1 10*3/uL (ref 4.0–10.5)
nRBC: 0 % (ref 0.0–0.2)

## 2018-07-26 LAB — APTT: aPTT: 28 seconds (ref 24–36)

## 2018-07-26 LAB — PROTIME-INR
INR: 1 (ref 0.8–1.2)
Prothrombin Time: 13 seconds (ref 11.4–15.2)

## 2018-07-26 MED ORDER — ACETAMINOPHEN 500 MG PO TABS
500.0000 mg | ORAL_TABLET | Freq: Once | ORAL | Status: DC
  Filled 2018-07-26: qty 1

## 2018-07-26 MED ORDER — IOPAMIDOL (ISOVUE-200) INJECTION 41%
20.0000 mL | Freq: Once | INTRAVENOUS | Status: AC | PRN
  Administered 2018-07-26: 20 mL via INTRAVENOUS
  Filled 2018-07-26: qty 50

## 2018-07-26 MED ORDER — ONDANSETRON HCL 4 MG/2ML IJ SOLN
4.0000 mg | Freq: Four times a day (QID) | INTRAMUSCULAR | Status: DC | PRN
  Filled 2018-07-26: qty 2

## 2018-07-26 MED ORDER — ACETAMINOPHEN 500 MG PO TABS
1000.0000 mg | ORAL_TABLET | Freq: Once | ORAL | Status: AC
  Administered 2018-07-26: 1000 mg via ORAL
  Filled 2018-07-26: qty 2

## 2019-07-18 ENCOUNTER — Other Ambulatory Visit: Payer: Self-pay | Admitting: Orthopaedic Surgery

## 2019-07-18 DIAGNOSIS — M48062 Spinal stenosis, lumbar region with neurogenic claudication: Secondary | ICD-10-CM

## 2019-07-22 ENCOUNTER — Ambulatory Visit: Payer: Medicare Other

## 2019-07-22 ENCOUNTER — Other Ambulatory Visit: Payer: Self-pay | Admitting: Orthopaedic Surgery

## 2019-07-22 DIAGNOSIS — M4807 Spinal stenosis, lumbosacral region: Secondary | ICD-10-CM

## 2019-08-02 ENCOUNTER — Ambulatory Visit: Payer: Medicare Other

## 2020-03-12 ENCOUNTER — Encounter: Payer: Self-pay | Admitting: Otolaryngology

## 2020-03-12 ENCOUNTER — Other Ambulatory Visit: Payer: Self-pay

## 2020-03-12 ENCOUNTER — Other Ambulatory Visit
Admission: RE | Admit: 2020-03-12 | Discharge: 2020-03-12 | Disposition: A | Discharge status: Home / Self Care | Payer: Medicare Other | Source: Ambulatory Visit | Attending: Otolaryngology | Admitting: Otolaryngology

## 2020-03-12 DIAGNOSIS — Z01812 Encounter for preprocedural laboratory examination: Secondary | ICD-10-CM | POA: Insufficient documentation

## 2020-03-12 DIAGNOSIS — Z20822 Contact with and (suspected) exposure to covid-19: Secondary | ICD-10-CM | POA: Insufficient documentation

## 2020-03-12 LAB — SARS CORONAVIRUS 2 (TAT 6-24 HRS): SARS Coronavirus 2: NEGATIVE

## 2020-03-13 NOTE — Discharge Instructions (Signed)
[facility] DISCHARGE INSTRUCTIONS FOR MYRINGOTOMY AND TUBE INSERTION  [facility] Carloyn Manner, M.D.   Diet:   After surgery, the patient should take only liquids and foods as tolerated.  The patient may then have a regular diet after the effects of anesthesia have worn off, usually about four to six hours after surgery.  Activities:   The patient should rest until the effects of anesthesia have worn off.  After this, there are no restrictions on the normal daily activities.  Medications:   You will be given antibiotic drops to be used in the ears postoperatively.  It is recommended to use 4 drops 2 times a day for 4 days, then the drops should be saved for possible future use.  The tubes should not cause any discomfort to the patient, but if there is any question, Tylenol should be given according to the instructions for the age of the patient.  Other medications should be continued normally.  Precautions:   Should there be recurrent drainage after the tubes are placed, the drops should be used for approximately 3-4 days.  If it does not clear, you should call the ENT office.  Earplugs:   Earplugs are only needed for those who are going to be submerged under water.  When taking a bath or shower and using a cup or showerhead to rinse hair, it is not necessary to wear earplugs.  These come in a variety of fashions, all of which can be obtained at our office.  However, if one is not able to come by the office, then silicone plugs can be found at most pharmacies.  It is not advised to stick anything in the ear that is not approved as an earplug.  Silly putty is not to be used as an earplug.  Swimming is allowed in patients after ear tubes are inserted, however, they must wear earplugs if they are going to be submerged under water.  For those children who are going to be swimming a lot, it is recommended to use a fitted ear mold, which can be made by our  audiologist.  If discharge is noticed from the ears, this most likely represents an ear infection.  We would recommend getting your eardrops and using them as indicated above.  If it does not clear, then you should call the ENT office.  For follow up, the patient should return to the ENT office three weeks postoperatively and then every six months as required by the doctor.   General Anesthesia, Adult, Care After This sheet gives you information about how to care for yourself after your procedure. Your health care provider may also give you more specific instructions. If you have problems or questions, contact your health care provider. What can I expect after the procedure? After the procedure, the following side effects are common:  Pain or discomfort at the IV site.  Nausea.  Vomiting.  Sore throat.  Trouble concentrating.  Feeling cold or chills.  Feeling weak or tired.  Sleepiness and fatigue.  Soreness and body aches. These side effects can affect parts of the body that were not involved in surgery. Follow these instructions at home: For the time period you were told by your health care provider:  Rest.  Do not participate in activities where you could fall or become injured.  Do not drive or use machinery.  Do not drink alcohol.  Do not take sleeping pills or medicines that cause drowsiness.  Do not make important decisions or sign legal documents.  Do not take care of children on your own.   Eating and drinking  Follow any instructions from your health care provider about eating or drinking restrictions.  When you feel hungry, start by eating small amounts of foods that are soft and easy to digest (bland), such as toast. Gradually return to your regular diet.  Drink enough fluid to keep your urine pale yellow.  If you vomit, rehydrate by drinking water, juice, or clear broth. General instructions  If you have sleep apnea, surgery and certain medicines can  increase your risk for breathing problems. Follow instructions from your health care provider about wearing your sleep device: ? Anytime you are sleeping, including during daytime naps. ? While taking prescription pain medicines, sleeping medicines, or medicines that make you drowsy.  Have a responsible adult stay with you for the time you are told. It is important to have someone help care for you until you are awake and alert.  Return to your normal activities as told by your health care provider. Ask your health care provider what activities are safe for you.  Take over-the-counter and prescription medicines only as told by your health care provider.  If you smoke, do not smoke without supervision.  Keep all follow-up visits as told by your health care provider. This is important. Contact a health care provider if:  You have nausea or vomiting that does not get better with medicine.  You cannot eat or drink without vomiting.  You have pain that does not get better with medicine.  You are unable to pass urine.  You develop a skin rash.  You have a fever.  You have redness around your IV site that gets worse. Get help right away if:  You have difficulty breathing.  You have chest pain.  You have blood in your urine or stool, or you vomit blood. Summary  After the procedure, it is common to have a sore throat or nausea. It is also common to feel tired.  Have a responsible adult stay with you for the time you are told. It is important to have someone help care for you until you are awake and alert.  When you feel hungry, start by eating small amounts of foods that are soft and easy to digest (bland), such as toast. Gradually return to your regular diet.  Drink enough fluid to keep your urine pale yellow.  Return to your normal activities as told by your health care provider. Ask your health care provider what activities are safe for you. This information is not intended  to replace advice given to you by your health care provider. Make sure you discuss any questions you have with your health care provider. Document Revised: 09/29/2019 Document Reviewed: 04/28/2019 Elsevier Patient Education  2021 [employer].

## 2020-03-14 ENCOUNTER — Ambulatory Visit: Payer: Medicare Other | Admitting: Anesthesiology

## 2020-03-14 ENCOUNTER — Encounter: Payer: Self-pay | Admitting: Otolaryngology

## 2020-03-14 ENCOUNTER — Encounter
Admission: RE | Disposition: A | Discharge status: Home / Self Care | Payer: Self-pay | Source: Home / Self Care | Attending: Otolaryngology

## 2020-03-14 ENCOUNTER — Other Ambulatory Visit: Payer: Self-pay

## 2020-03-14 ENCOUNTER — Ambulatory Visit
Admission: RE | Admit: 2020-03-14 | Discharge: 2020-03-14 | Disposition: A | Discharge status: Home / Self Care | Payer: Medicare Other | Attending: Otolaryngology | Admitting: Otolaryngology

## 2020-03-14 DIAGNOSIS — Z7901 Long term (current) use of anticoagulants: Secondary | ICD-10-CM | POA: Diagnosis not present

## 2020-03-14 DIAGNOSIS — Z79899 Other long term (current) drug therapy: Secondary | ICD-10-CM | POA: Insufficient documentation

## 2020-03-14 DIAGNOSIS — H6982 Other specified disorders of Eustachian tube, left ear: Secondary | ICD-10-CM | POA: Diagnosis present

## 2020-03-14 DIAGNOSIS — F17200 Nicotine dependence, unspecified, uncomplicated: Secondary | ICD-10-CM | POA: Insufficient documentation

## 2020-03-14 HISTORY — PX: MYRINGOTOMY WITH TUBE PLACEMENT: SHX5663

## 2020-03-14 SURGERY — MYRINGOTOMY WITH TUBE PLACEMENT
Anesthesia: General | Site: Ear | Laterality: Left

## 2020-03-14 MED ORDER — LACTATED RINGERS IV SOLN: INTRAVENOUS | Status: DC

## 2020-03-14 MED ORDER — CIPROFLOXACIN-DEXAMETHASONE 0.3-0.1 % OT SUSP
OTIC | Status: DC | PRN
  Administered 2020-03-14: 4 [drp] via OTIC

## 2020-03-14 MED ORDER — PROPOFOL 10 MG/ML IV BOLUS
INTRAVENOUS | Status: DC | PRN
Start: 2020-03-14 — End: 2020-03-14
  Administered 2020-03-14: 70 mg via INTRAVENOUS

## 2020-03-14 MED ORDER — GLYCOPYRROLATE 0.2 MG/ML IJ SOLN
INTRAMUSCULAR | Status: DC | PRN
  Administered 2020-03-14: .1 mg via INTRAVENOUS

## 2020-03-14 MED ORDER — ONDANSETRON HCL 4 MG/2ML IJ SOLN
INTRAMUSCULAR | Status: DC | PRN
  Administered 2020-03-14: 4 mg via INTRAVENOUS

## 2020-03-14 SURGICAL SUPPLY — 13 items
BALL CTTN LRG ABS STRL LF (GAUZE/BANDAGES/DRESSINGS) ×1
BLADE MYR LANCE NRW W/HDL (BLADE) ×2 IMPLANT
CANISTER SUCT 1200ML W/VALVE (MISCELLANEOUS) ×2 IMPLANT
COTTONBALL LRG STERILE PKG (GAUZE/BANDAGES/DRESSINGS) ×2 IMPLANT
GLOVE PROTEXIS LATEX SZ 7.5 (GLOVE) ×4 IMPLANT
GLOVE SURG LATEX 7.5 PF (GLOVE) IMPLANT
STRAP BODY AND KNEE 60X3 (MISCELLANEOUS) ×2 IMPLANT
TOWEL OR 17X26 4PK STRL BLUE (TOWEL DISPOSABLE) ×2 IMPLANT
TUBE EAR ARMSTRONG HC 1.14X3.5 (OTOLOGIC RELATED) ×2 IMPLANT
TUBE EAR T 1.27X4.5 GO LF (OTOLOGIC RELATED) IMPLANT
TUBE EAR T 1.27X5.3 BFLY (OTOLOGIC RELATED) ×1 IMPLANT
TUBING CONN 6MMX3.1M (TUBING) ×1
TUBING SUCTION CONN 0.25 STRL (TUBING) ×1 IMPLANT

## 2020-03-14 NOTE — Anesthesia Preprocedure Evaluation (Signed)
Anesthesia Evaluation  Patient identified by MRN, date of birth, ID band Patient awake    Reviewed: Allergy & Precautions, H&P , NPO status , Patient's Chart, lab work & pertinent test results  Airway Mallampati: II  TM Distance: >3 FB Neck ROM: full    Dental no notable dental hx.    Pulmonary Current Smoker,    Pulmonary exam normal        Cardiovascular hypertension, On Medications Normal cardiovascular exam     Neuro/Psych    GI/Hepatic negative GI ROS, Neg liver ROS,   Endo/Other  negative endocrine ROS  Renal/GU negative Renal ROS  negative genitourinary   Musculoskeletal   Abdominal   Peds  Hematology   Anesthesia Other Findings   Reproductive/Obstetrics negative OB ROS                             Anesthesia Physical Anesthesia Plan  ASA: II  Anesthesia Plan: General   Post-op Pain Management:    Induction:   PONV Risk Score and Plan: 1 and Treatment may vary due to age or medical condition  Airway Management Planned:   Additional Equipment:   Intra-op Plan:   Post-operative Plan:   Informed Consent: I have reviewed the patients History and Physical, chart, labs and discussed the procedure including the risks, benefits and alternatives for the proposed anesthesia with the patient or authorized representative who has indicated his/her understanding and acceptance.       Plan Discussed with:   Anesthesia Plan Comments:         Anesthesia Quick Evaluation

## 2020-03-14 NOTE — Op Note (Signed)
..  03/14/2020  8:20 AM    Vivianne Master  [id number]   Pre-Op Dx:  eustachian tube dysfunction  Post-op Dx: eustachian tube dysfunction  Proc: Left myringotomy and tympanostomy tube placement  Surg: Jeannie Fend Alaira Level  Anes:  General by mask  EBL:  None  Comp:  None  Findings:  Granulation tissue and blood filling external auditory canal.  Tube and debris removed and new butterfly tube placed within existing perforation.  Procedure: With the patient in a comfortable supine position, general mask anesthesia was administered.  At an appropriate level, microscope and speculum were used to examine and clean the LEFT ear canal.  The findings were as described above.  The previous butterfly tube was removed.  Middle ear contents were suctioned clear with a size 5 otologic suction.  A PE tube was placed without difficulty using a Rosen pick and Animal nutritionist.  Ciprodex otic solution was instilled into the external canal, and insufflated into the middle ear.  A cotton ball was placed at the external meatus. Hemostasis was observed.  This side was completed.  Following this, the patient was returned to anesthesia, awakened, and transferred to recovery in stable condition.  Dispo:  PACU to home  Plan: Routine drop use and water precautions.  Recheck my office three weeks.   Wenona May[place] 8:20 AM 03/14/2020

## 2020-03-14 NOTE — H&P (Signed)
..  History and Physical paper copy reviewed and updated date of procedure and will be scanned into system.  Patient seen and examined.  

## 2020-03-14 NOTE — Anesthesia Postprocedure Evaluation (Signed)
Anesthesia Post Note  Patient: Elijah Lopez.  Procedure(s) Performed: MYRINGOTOMY WITH BUTTERFLY TUBE PLACEMENT (Left Ear)     Patient location during evaluation: PACU Anesthesia Type: General Level of consciousness: awake and alert Pain management: pain level controlled Vital Signs Assessment: post-procedure vital signs reviewed and stable Respiratory status: spontaneous breathing Cardiovascular status: stable Anesthetic complications: no   No complications documented.  Gillian Scarce

## 2020-03-14 NOTE — Transfer of Care (Signed)
Immediate Anesthesia Transfer of Care Note  Patient: Elijah Lopez.  Procedure(s) Performed: MYRINGOTOMY WITH BUTTERFLY TUBE PLACEMENT (Left Ear)  Patient Location: PACU  Anesthesia Type: General  Level of Consciousness: awake, alert  and patient cooperative  Airway and Oxygen Therapy: Patient Spontanous Breathing and Patient connected to supplemental oxygen  Post-op Assessment: Post-op Vital signs reviewed, Patient's Cardiovascular Status Stable, Respiratory Function Stable, Patent Airway and No signs of Nausea or vomiting  Post-op Vital Signs: Reviewed and stable  Complications: No complications documented.

## 2020-03-14 NOTE — Anesthesia Procedure Notes (Signed)
Procedure Name: General with mask airway Performed by: Dairon Procter, CRNA Pre-anesthesia Checklist: Patient identified, Patient being monitored, Emergency Drugs available, Timeout performed and Suction available Patient Re-evaluated:Patient Re-evaluated prior to induction Oxygen Delivery Method: Circle system utilized Preoxygenation: Pre-oxygenation with 100% oxygen Induction Type: Combination inhalational/ intravenous induction Ventilation: Mask ventilation without difficulty Dental Injury: Teeth and Oropharynx as per pre-operative assessment        

## 2020-03-16 ENCOUNTER — Encounter: Payer: Self-pay | Admitting: Otolaryngology

## 2020-04-13 ENCOUNTER — Other Ambulatory Visit: Payer: Self-pay

## 2020-04-13 ENCOUNTER — Ambulatory Visit
Admission: EM | Admit: 2020-04-13 | Discharge: 2020-04-13 | Disposition: A | Discharge status: Home / Self Care | Payer: Medicare Other | Attending: Physician Assistant | Admitting: Physician Assistant

## 2020-04-13 ENCOUNTER — Encounter: Payer: Self-pay | Admitting: Emergency Medicine

## 2020-04-13 DIAGNOSIS — W57XXXA Bitten or stung by nonvenomous insect and other nonvenomous arthropods, initial encounter: Secondary | ICD-10-CM

## 2020-04-13 DIAGNOSIS — S70361A Insect bite (nonvenomous), right thigh, initial encounter: Secondary | ICD-10-CM

## 2020-04-13 MED ORDER — TRIAMCINOLONE ACETONIDE 0.5 % EX OINT: 1.0000 "application " | TOPICAL_OINTMENT | Freq: Two times a day (BID) | CUTANEOUS | 0 refills | Status: AC

## 2020-04-13 MED ORDER — DOXYCYCLINE HYCLATE 100 MG PO TABS
200.0000 mg | ORAL_TABLET | Freq: Every day | ORAL | Status: AC
  Administered 2020-04-13: 200 mg via ORAL

## 2020-04-13 NOTE — ED Provider Notes (Signed)
[facility]    CSN: [id number] Arrival date & time: 04/13/20  1956      History   Chief Complaint Chief Complaint  Patient presents with  . Tick Removal    HPI Elijah Lopez. is a 80 y.o. male presenting for tick bite of the right inner thigh. Patient says he removed the tick with alcohol and tweezers tonight but noticed what looked like a skin tag for 3 days. He believes now that it was actually the tick embedded in the skin.  Patient says the tick looks like a deer tick.  He says there is some redness and swelling in the area and a little bit of tenderness.  He denies any drainage or bleeding from the site.  He denies any fever, fatigue, headaches, joint pains.  Has not applied any topical ointments or taken any medication for the tick bite.  No other concerns.  HPI  Past Medical History:  Diagnosis Date  . Amnesia, global, transient   . Anxiety   . Blood dyscrasia   . BPH (benign prostatic hyperplasia)   . Cancer ([place])    skin  . DVT (deep venous thrombosis) ([place])   . Factor 5 Leiden mutation, 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[place])   . Hematuria   . History of kidney stones   . Hypertension   . Monoclonal gammopathy   . Monoclonal gammopathy of unknown significance (MGUS)   . Monoclonal paraproteinemia   . Tinnitus     Patient Active Problem List   Diagnosis Date Noted  . Hyperlipidemia, unspecified 04/10/2016  . Factor 5 Leiden mutation, 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[place]) 11/14/2015  . S/P cholecystectomy 10/28/2015  . Anxiety 07/17/2015  . Transaminitis 06/24/2015  . History of malignant neoplasm of skin 11/14/2014  . Personal history of other malignant neoplasm of skin 11/14/2014  . Borderline diabetes mellitus 08/12/2014  . Prediabetes 08/12/2014  . Family history of cardiac disorder 02/10/2014  . FH: heart disease 02/10/2014  . Anxiety state 09/13/2013  . Hyperlipidemia 09/13/2013  . Essential hypertension 10/25/2012  . HTN (hypertension) 10/25/2012  . Hypertension  10/25/2012  . Insomnia 10/25/2012  . Bladder stone 09/01/2012  . Deep vein thrombosis (DVT) ([place]) 09/01/2012  . Diverticula of colon 09/01/2012  . Heterozygous factor V Leiden mutation ([place]) 09/01/2012  . MGUS (monoclonal gammopathy of unknown significance) 09/01/2012  . S/P colectomy 09/01/2012  . 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  . Dysuria 08/26/2012  . Gross hematuria 08/26/2012  . Slowing of urinary stream 08/26/2012  . Hypertrophy of prostate with urinary obstruction and other lower urinary tract symptoms (LUTS) 08/25/2012  . Anticoagulant long-term use 08/25/2011  . Encounter for current long-term use of anticoagulants 08/25/2011  . Long term current use of anticoagulant therapy 08/25/2011  . Diverticular disease 07/22/2011  . Diverticulosis of colon 07/22/2011  . Diverticulosis of intestine 07/22/2011  . Monoclonal gammopathy 07/22/2011  . Monoclonal paraproteinemia 07/22/2011  . Urinary calculus 07/22/2011  . Urolithiasis 07/22/2011  . Dyspepsia 01/08/2011  . Dyspepsia and disorder of function of stomach 01/08/2011  . Indigestion 01/08/2011  . 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 APPENDECTOMY    . CHOLECYSTECTOMY     6/17  . COLON SURGERY    . ELBOW SURGERY    . HAND SURGERY    . INNER EAR SURGERY Right    prosthetic  . IVC FILTER PLACEMENT ([facility] HX)    .  IVC FILTER PLACEMENT ([facility] HX)     retrivial  . MYRINGOTOMY WITH TUBE PLACEMENT Left 11/22/2015   Procedure: MYRINGOTOMY WITH TUBE PLACEMENT;  Surgeon: Carloyn Manner, MD;  Location: [facility];  Service: ENT;  Laterality: Left;  . MYRINGOTOMY WITH TUBE PLACEMENT Left 03/14/2020   Procedure: MYRINGOTOMY WITH BUTTERFLY TUBE PLACEMENT;  Surgeon: Carloyn Manner, MD;  Location: [facility];  Service: ENT;  Laterality: Left;  . SHOULDER SURGERY Left    has broken pin       Home  Medications    Prior to Admission medications   Medication Sig Start Date End Date Taking? Authorizing Provider  Acetaminophen (TYLENOL 8 HOUR PO) Take by mouth. As needed   Yes [provider]  amLODipine (NORVASC) 5 MG tablet Take 5 mg by mouth daily. 1/2 tab am and pm   Yes [provider]  Cholecalciferol (VITAMIN D3) 1000 units CAPS Take by mouth.   Yes [provider]  clonazePAM (KLONOPIN) 1 MG tablet Take 1 mg by mouth 3 (three) times daily.   Yes [provider]  lisinopril (PRINIVIL,ZESTRIL) 5 MG tablet Take 7.5 mg by mouth daily.   Yes [provider]  omeprazole (PRILOSEC OTC) 20 MG tablet Take by mouth. 04/04/13  Yes [provider]  rivaroxaban (XARELTO) 10 MG TABS tablet Take 10 mg by mouth daily. pm   Yes [provider]  sodium fluoride (FLUORISHIELD) 1.1 % GEL dental gel  04/17/13  Yes [provider]  triamcinolone ointment (KENALOG) 0.5 % Apply 1 application topically 2 (two) times daily for 7 days. 04/13/20 04/20/20 Yes Danton Clap, PA-C    Family History Family History  Problem Relation Age of Onset  . Other Mother 40       "natural causes"  . Hypertension Mother   . Diverticulitis Mother   . Heart attack Father 46    Social History Social History   Tobacco Use  . Smoking status: Current Some Day Smoker    Types: Cigars  . Smokeless tobacco: Never Used  Vaping Use  . Vaping Use: Never used  Substance Use Topics  . Alcohol use: Yes    Comment: rare  . Drug use: No     Allergies   Ceftin [cefuroxime axetil], Trazodone and nefazodone, Eryc [erythromycin], Sulfamethoxazole, Dilaudid [hydromorphone hcl], Elavil [amitriptyline hcl], Hctz [hydrochlorothiazide], Percodan [oxycodone-aspirin], Piroxicam, Promethazine, Simvastatin, Triamterene, Tussionex pennkinetic er [employer] polst-cpm polst er], Amitriptyline, Amoxapine and related, Amoxicillin, Ativan [lorazepam], Codeine, Erythromycin  base, Gabapentin, Hydromorphone, Lecithin, Naproxen, Naproxen sodium, Nortriptyline, Nsaids, Orudis [ketoprofen], Oxycodone-acetaminophen, Sertraline, Sonata [zaleplon], and Zoloft [sertraline hcl]   Review of Systems Review of Systems  Constitutional: Negative for fatigue and fever.  Musculoskeletal: Negative for arthralgias and myalgias.  Skin: Positive for color change. Negative for rash and wound.  Neurological: Negative for weakness and numbness.     Physical Exam Triage Vital Signs ED Triage Vitals  Enc Vitals Group     BP 04/13/20 2007 (!) 143/104     Pulse Rate 04/13/20 2007 (!) 110     Resp 04/13/20 2007 18     Temp 04/13/20 2007 98.1 F (36.7 C)     Temp Source 04/13/20 2007 Oral     SpO2 04/13/20 2007 96 %     Weight 04/13/20 2007 192 lb (87.1 kg)     Height 04/13/20 2007 5\' 10"  (1.778 m)     Head Circumference --      Peak Flow --  Pain Score 04/13/20 2006 0     Pain Loc --      Pain Edu? --      Excl. in [place]? --    No data found.  Updated Vital Signs BP 123/81 (BP Location: Right Arm)   Pulse (!) 110   Temp 98.1 F (36.7 C) (Oral)   Resp 18   Ht 5\' 10"  (1.778 m)   Wt 192 lb (87.1 kg)   SpO2 96%   BMI 27.55 kg/m       Physical Exam Vitals and nursing note reviewed.  Constitutional:      General: He is not in acute distress.    Appearance: Normal appearance. He is well-developed. He is not ill-appearing or toxic-appearing.  HENT:     Head: Normocephalic and atraumatic.  Eyes:     General: No scleral icterus.    Conjunctiva/sclera: Conjunctivae normal.  Cardiovascular:     Rate and Rhythm: Regular rhythm. Tachycardia present.     Heart sounds: Normal heart sounds.  Pulmonary:     Effort: Pulmonary effort is normal. No respiratory distress.     Breath sounds: Normal breath sounds.  Musculoskeletal:     Cervical back: Neck supple.  Skin:    General: Skin is warm and dry.     Findings: Lesion (there are 2 small erythematous papules of the  right inner thigh consistent with insect/tick bite. Areas very minimally tender to palpation. ) present.  Neurological:     General: No focal deficit present.     Mental Status: He is alert. Mental status is at baseline.     Motor: No weakness.     Gait: Gait normal.  Psychiatric:        Mood and Affect: Mood normal.        Behavior: Behavior normal.        Thought Content: Thought content normal.      UC Treatments / Results  Labs (all labs ordered are listed, but only abnormal results are displayed) Labs Reviewed - No data to display  EKG   Radiology No results found.  Procedures Procedures (including critical care time)  Medications Ordered in UC Medications  doxycycline (VIBRA-TABS) tablet 200 mg (200 mg Oral Given 04/13/20 2022)    Initial Impression / Assessment and Plan / UC Course  I have reviewed the triage vital signs and the nursing notes.  Pertinent labs & imaging results that were available during my care of the patient were reviewed by me and considered in my medical decision making (see chart for details).   80 y/o male presenting for tick bites of the right inner thigh. He does believe the tick was a deer tick and it was possibly embedded in his skin x 3 days.   Patient given 200 mg oral doxycycline in clinic x 1 for prophylaxis and advised close monitoring. Discussed signs/symptoms of tickborne disease and advised following up if that occurs. Prescribed topical triamcinolone for the lesion.   Final Clinical Impressions(s) / UC Diagnoses   Final diagnoses:  Tick bite of right thigh, initial encounter     Discharge Instructions     You have been given 200 mg doxycycline in the clinic.  This is an antibiotic used to treat most tickborne diseases.  It should prevent most cases of Lyme disease as started within the first 3 days.  I have sent a corticosteroid ointment for you to start as well.  Keep an eye on this area and if you  notice any spreading  redness or you develop a red ring around the lesion then you should call, return or go to PCP for another exam.  In that case you may need more antibiotics.    ED Prescriptions    Medication Sig Dispense Auth. Provider   triamcinolone ointment (KENALOG) 0.5 % Apply 1 application topically 2 (two) times daily for 7 days. 30 g Gretta Cool     PDMP not reviewed this encounter.   Danton Clap, PA-C 04/14/20 [phone]

## 2020-04-13 NOTE — Discharge Instructions (Signed)
You have been given 200 mg doxycycline in the clinic.  This is an antibiotic used to treat most tickborne diseases.  It should prevent most cases of Lyme disease as started within the first 3 days.  I have sent a corticosteroid ointment for you to start as well.  Keep an eye on this area and if you notice any spreading redness or you develop a red ring around the lesion then you should call, return or go to PCP for another exam.  In that case you may need more antibiotics.

## 2020-04-13 NOTE — ED Triage Notes (Signed)
Patient in today c/o a tick bite on his right inner thigh x today. Patient states he noticed the area 3-4 days ago and thought it was a skin tag. Patient has removed the tick, but the area is red.

## 2020-04-26 ENCOUNTER — Ambulatory Visit
Admission: RE | Admit: 2020-04-26 | Discharge: 2020-04-26 | Disposition: A | Discharge status: Home / Self Care | Payer: Medicare Other | Source: Ambulatory Visit | Attending: Orthopaedic Surgery | Admitting: Orthopaedic Surgery

## 2020-04-26 ENCOUNTER — Other Ambulatory Visit: Payer: Self-pay

## 2020-04-26 DIAGNOSIS — M4807 Spinal stenosis, lumbosacral region: Secondary | ICD-10-CM | POA: Insufficient documentation

## 2020-04-26 DIAGNOSIS — M48061 Spinal stenosis, lumbar region without neurogenic claudication: Secondary | ICD-10-CM | POA: Insufficient documentation

## 2020-04-26 LAB — CBC
HCT: 44 % (ref 39.0–52.0)
Hemoglobin: 15 g/dL (ref 13.0–17.0)
MCH: 33.5 pg (ref 26.0–34.0)
MCHC: 34.1 g/dL (ref 30.0–36.0)
MCV: 98.2 fL (ref 80.0–100.0)
Platelets: 249 10*3/uL (ref 150–400)
RBC: 4.48 MIL/uL (ref 4.22–5.81)
RDW: 13 % (ref 11.5–15.5)
WBC: 4.6 10*3/uL (ref 4.0–10.5)
nRBC: 0 % (ref 0.0–0.2)

## 2020-04-26 LAB — PROTIME-INR
INR: 1.1 (ref 0.8–1.2)
Prothrombin Time: 13.5 seconds (ref 11.4–15.2)

## 2020-04-26 LAB — APTT: aPTT: 28 seconds (ref 24–36)

## 2020-04-26 MED ORDER — IOHEXOL 180 MG/ML  SOLN
20.0000 mL | Freq: Once | INTRAMUSCULAR | Status: AC | PRN
  Administered 2020-04-26: 15 mL

## 2020-04-26 MED ORDER — LIDOCAINE HCL (PF) 1 % IJ SOLN
5.0000 mL | Freq: Once | INTRAMUSCULAR | Status: AC
  Administered 2020-04-26: 5 mL
  Filled 2020-04-26: qty 5

## 2020-04-26 NOTE — Discharge Instructions (Signed)
Myelogram and Lumbar Puncture Discharge Instructions  1. Go home and rest quietly for the next 24 hours.  It is important to lie flat for the next 24 hours.  Get up only to go to the restroom.  You may lie in the bed or on a couch on your back, your stomach, your left side or your right side.  You may have one pillow under your head.  You may have pillows between your knees while you are on your side or under your knees while you are on your back.  2. DO NOT drive today.  Recline the seat as far back as it will go, while still wearing your seat belt, on the way home.  3. You may get up to go to the bathroom as needed.  You may sit up for 10 minutes to eat.  You may resume your normal diet and medications unless otherwise indicated.  4. The incidence of headache, nausea, or vomiting is about 5% (one in 20 patients).  If you develop a headache, lie flat and drink plenty of fluids until the headache goes away.  Caffeinated beverages may be helpful.  If you develop severe nausea and vomiting or a headache that does not go away with flat bed rest, call [phone]  5.  6. You may resume normal activities after your 24 hours of bed rest is over; however, do not exert yourself strongly or do any heavy lifting tomorrow.  7. Call your physician for a follow-up appointment.  The results of your myelogram will be sent directly to your physician by the following day.  8. If you have any questions or if complications develop after you arrive home, please [phone]  Discharge instructions have been explained to the patient.  The patient, or the person responsible for the patient, fully understands these instructions.

## 2020-04-26 NOTE — Progress Notes (Signed)
Pt stable after lumbar myelogram.Back stable.D/C instructions given.F/U with his MD.

## 2021-04-20 IMAGING — CT CT L SPINE W/ CM
3 of 4 series · 11 of 33 positions shown, 13 images · IV contrast (isovue)
Comparison: 07/26/2018

CLINICAL DATA: Spinal stenosis

EXAM:
CT MYELOGRAPHY LUMBAR SPINE
TECHNIQUE: CT imaging of the lumbar spine was performed after Isovue 200M
contrast administration. Multiplanar CT image reconstructions were
also generated. Lumbar puncture for intrathecal contrast injection
and interpretation of conventional myelographic images reported
separately.

[Series 5: multi disc · axial · 0.21mm/px · z∈[+765,+937]mm · 3 of 118 slices shown, 4 images]
[im 24/118  soft-tissue]
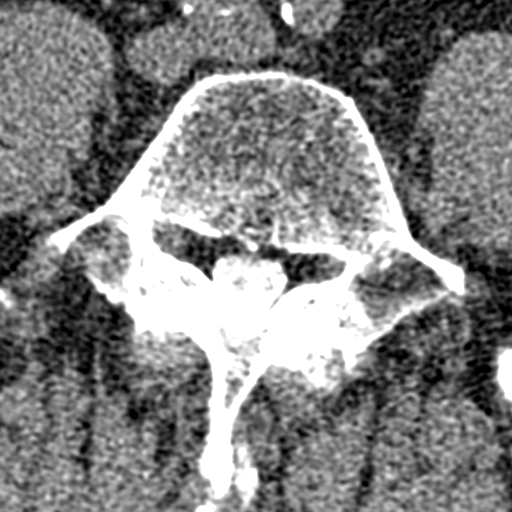
[im 24/118  bone]
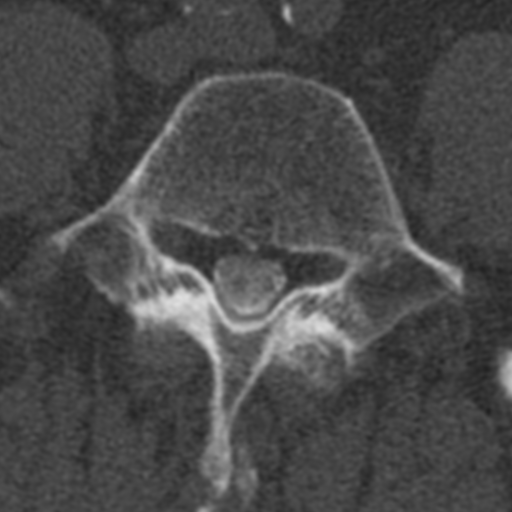
[im 71/118  bone]
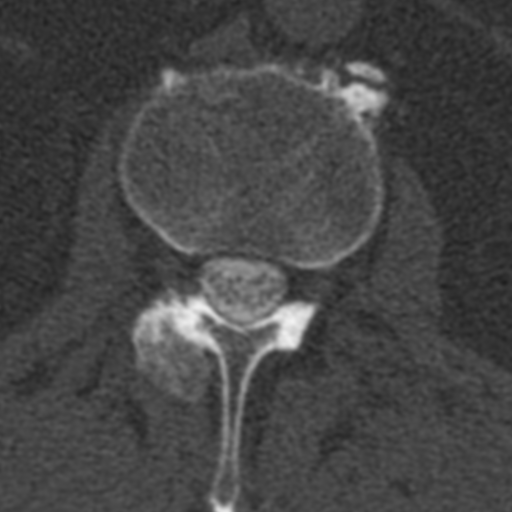
[im 94/118  bone]
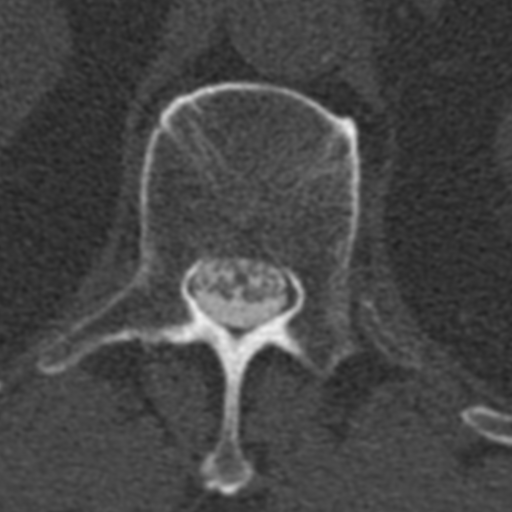

[Series 6: coronal bone · coronal · 0.41mm/px · 3 of 71 slices shown]
[im 15/71  bone]
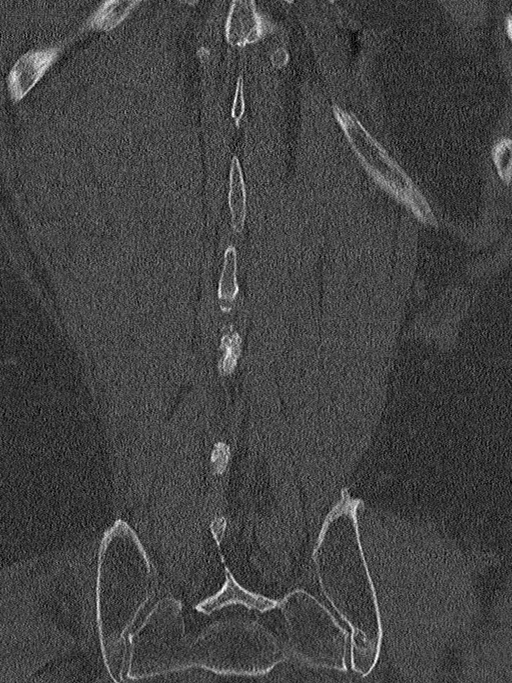
[im 29/71  bone]
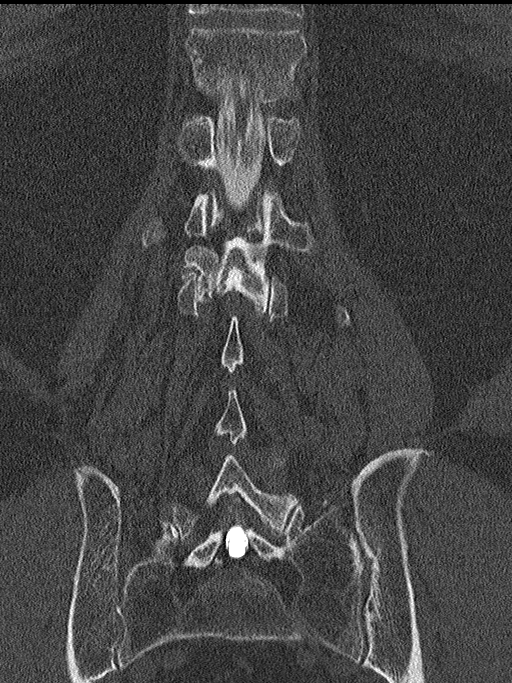
[im 43/71  bone]
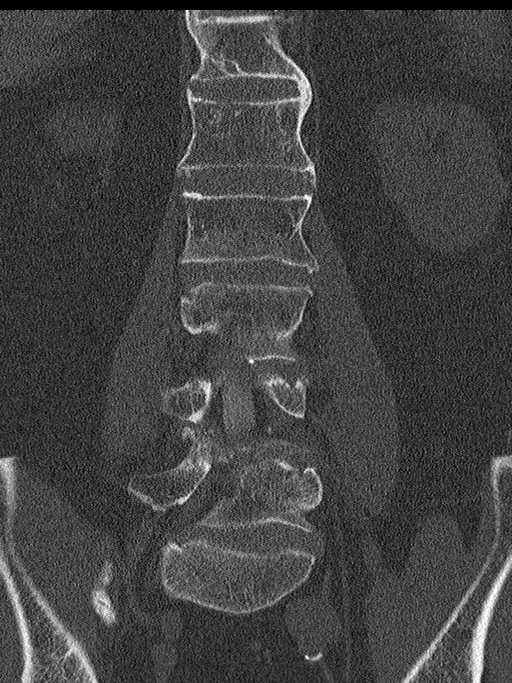

[Series 7: sagittal st · sagittal · 0.30mm/px · 5 of 80 slices shown, 6 images]
[im 27/80  bone]
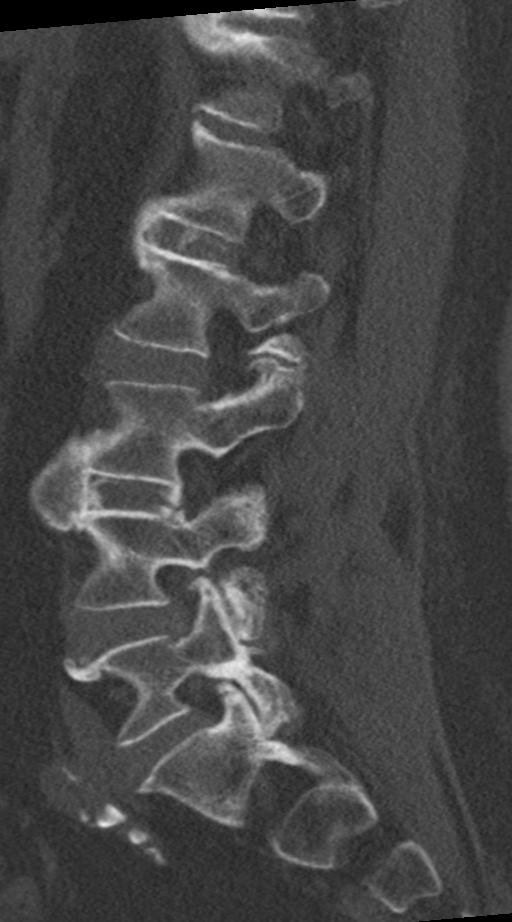
[im 33/80  bone]
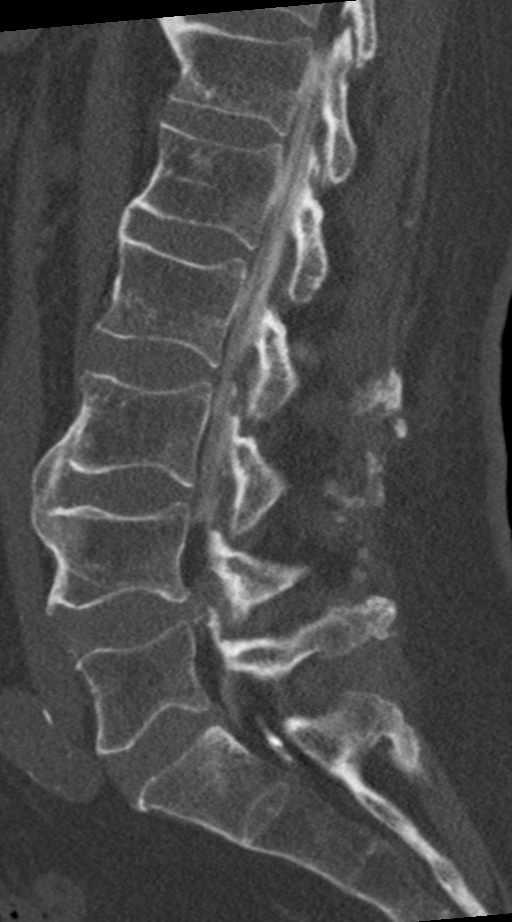
[im 40/80  soft-tissue]
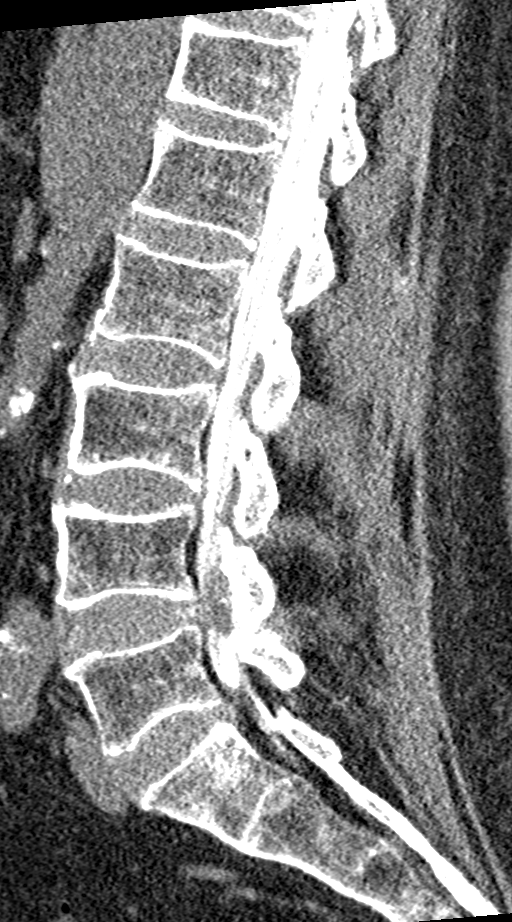
[im 40/80  bone]
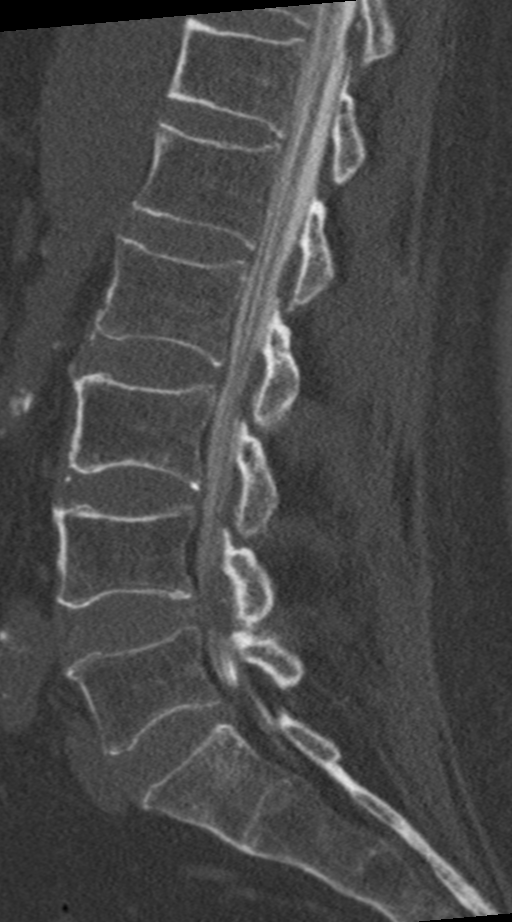
[im 47/80  bone]
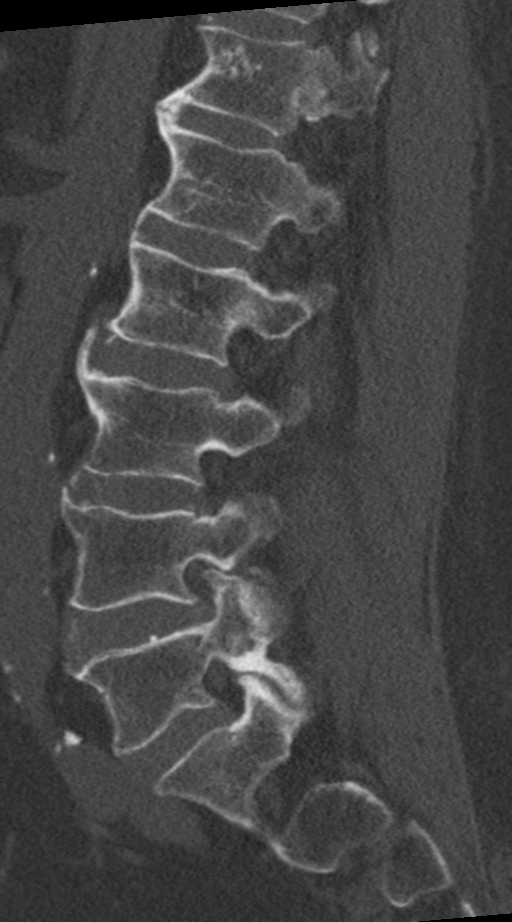
[im 53/80  bone]
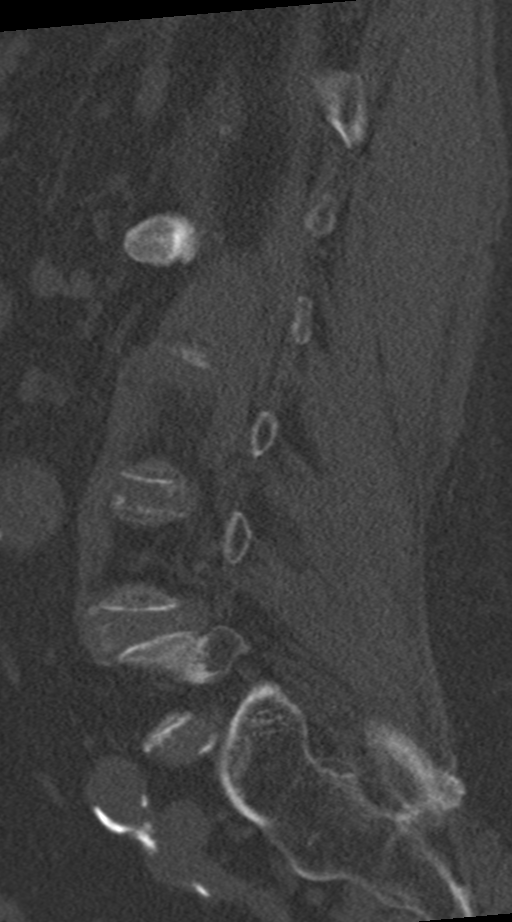

[11 of 33 positions shown; findings below may reference images not displayed]

FINDINGS: Segmentation:  5 lumbar type vertebral bodies.

Alignment: Stable with trace retrolisthesis at L3-L4.

Vertebrae: Stable vertebral body heights. No suspicious osseous
lesion.

Conus medullaris: Extends to the L1 level and appears normal.

Paraspinal and other soft tissues: Aortic atherosclerosis. As
before, there is focal dilatation of the distal abdominal aorta
without aneurysm. Unchanged aneurysmal dilatation of the right
common iliac artery with displacement of intimal calcification
suggesting chronic dissection. Stable mild aneurysmal dilatation of
the left common iliac.

Disc levels:

L1-L2:  Minor facet hypertrophy.  No canal or foraminal stenosis.

L2-L3: Disc bulge eccentric to the left. Mild facet hypertrophy. No
canal or foraminal stenosis. Far lateral component of the disc bulge
is in proximity to but does not definitely contact the
extraforaminal left L2 nerve root.

L3-L4: Disc bulge. Mild facet hypertrophy. No canal or foraminal
stenosis.

L4-L5: Disc bulge. Facet hypertrophy with ligamentum flavum
thickening. Severe canal stenosis with near total CSF space
effacement. Effacement of subarticular recesses. Mild to moderate
foraminal stenosis. Facet spurring may contact the exiting L4 nerve
roots, left more so than right.

L5-S1: Disc bulge. Right greater than left facet hypertrophy. No
canal or foraminal stenosis.
IMPRESSION: Severe canal stenosis at L4-L5 with effacement of subarticular
recesses. Facet spurring may contact the exiting nerve roots at this
level. Appearance is similar to the prior study.

Milder degenerative changes at other levels also similar to prior
study.

## 2021-04-20 IMAGING — RF DG MYELOGRAM LUMBAR
8 series · 8 of 8 positions shown · non-contrast
Comparison: CT myelogram 07/26/2018.

CLINICAL DATA: Spinal stenosis.
TECHNIQUE: Contiguous axial images were obtained through the Lumbar spine after
the intrathecal infusion of infusion. Coronal and sagittal
reconstructions were obtained of the axial image sets.

[Series 1: fluoro_myelogram_singleshot_bw · 0.17mm/px · 1 of 1 slices shown (1 of 8)]
[im 1/1]
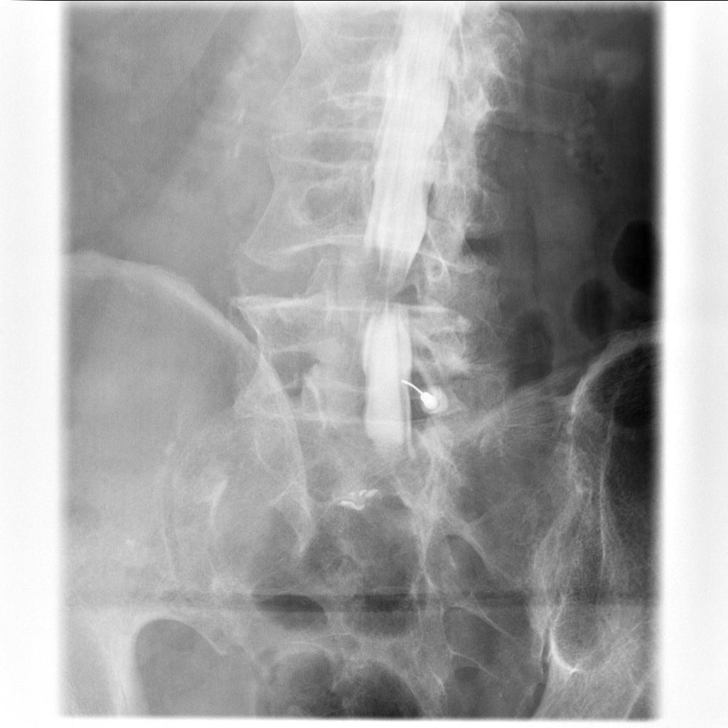

[Series 2: fluoro_myelogram_singleshot_bw · 0.18mm/px · 1 of 1 slices shown (2 of 8)]
[im 1/1]
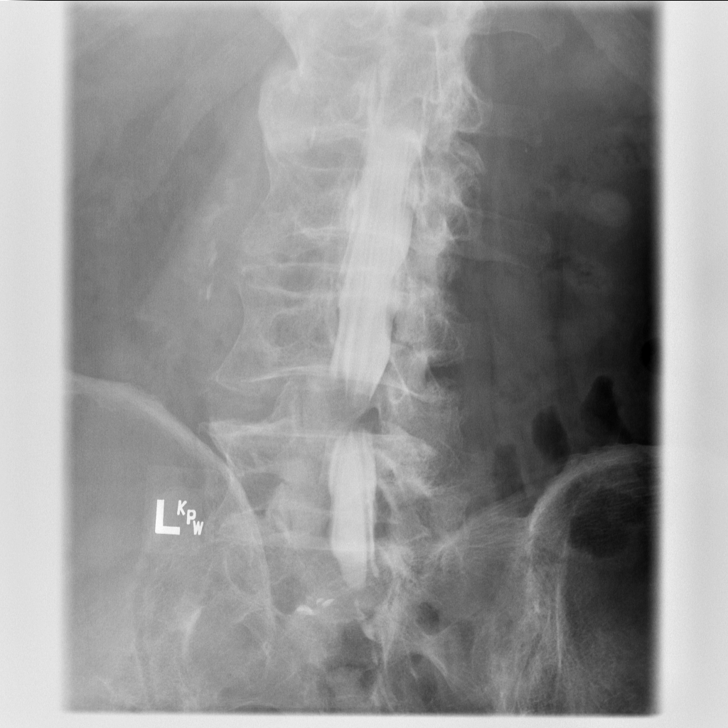

[Series 3: fluoro_myelogram_singleshot_bw · 0.18mm/px · 1 of 1 slices shown (3 of 8)]
[im 1/1]
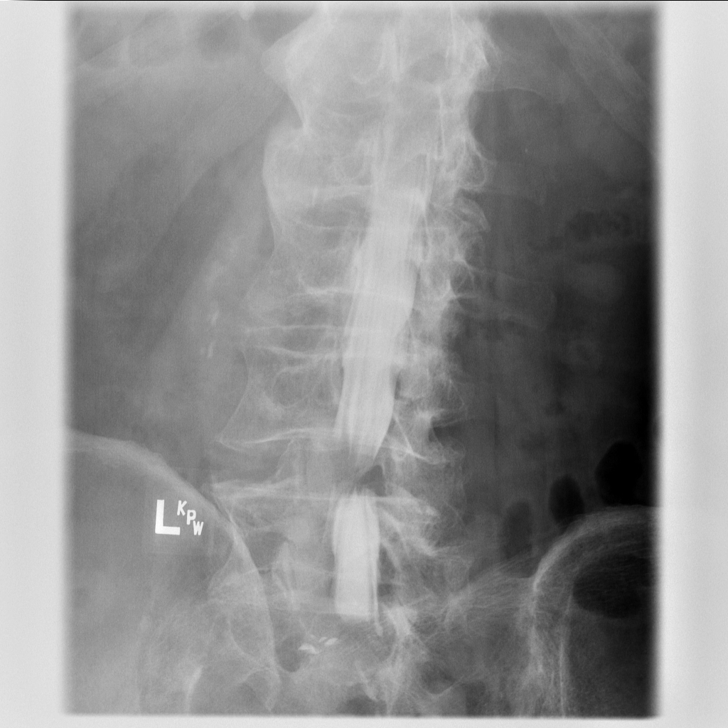

[Series 4: fluoro_myelogram_singleshot_bw · 0.18mm/px · 1 of 1 slices shown (4 of 8)]
[im 1/1]
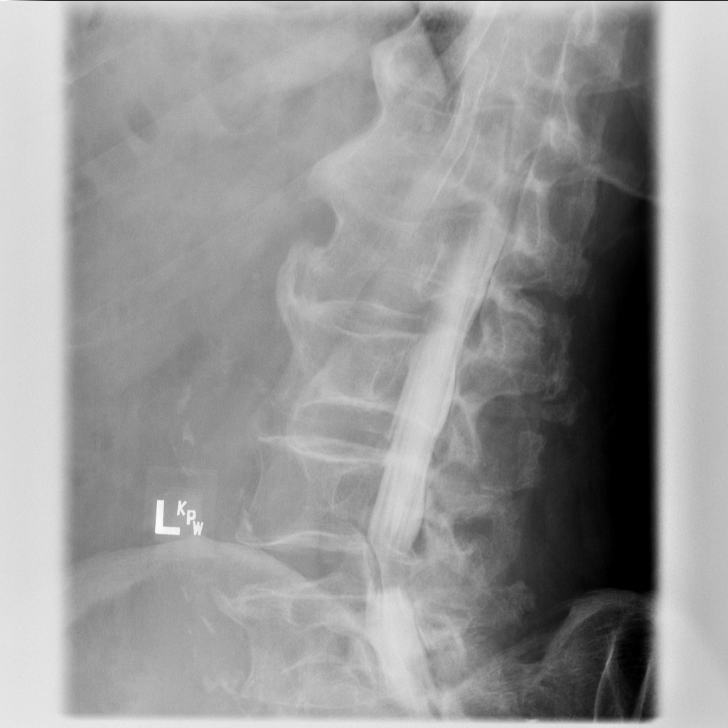

[Series 5: fluoro_myelogram_singleshot_bw · 0.20mm/px · 1 of 1 slices shown (5 of 8)]
[im 1/1]
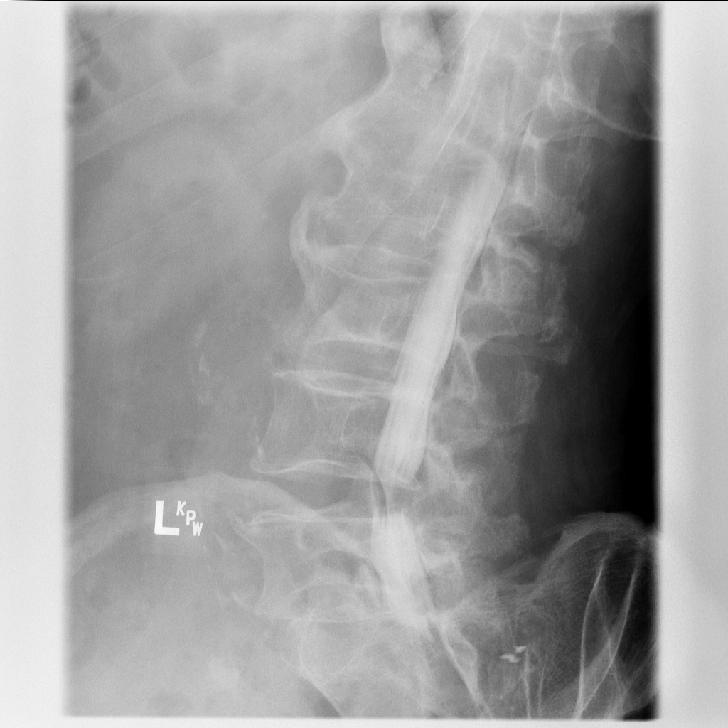

[Series 6: fluoro_myelogram_singleshot_bw · 0.20mm/px · 1 of 1 slices shown (6 of 8)]
[im 1/1]
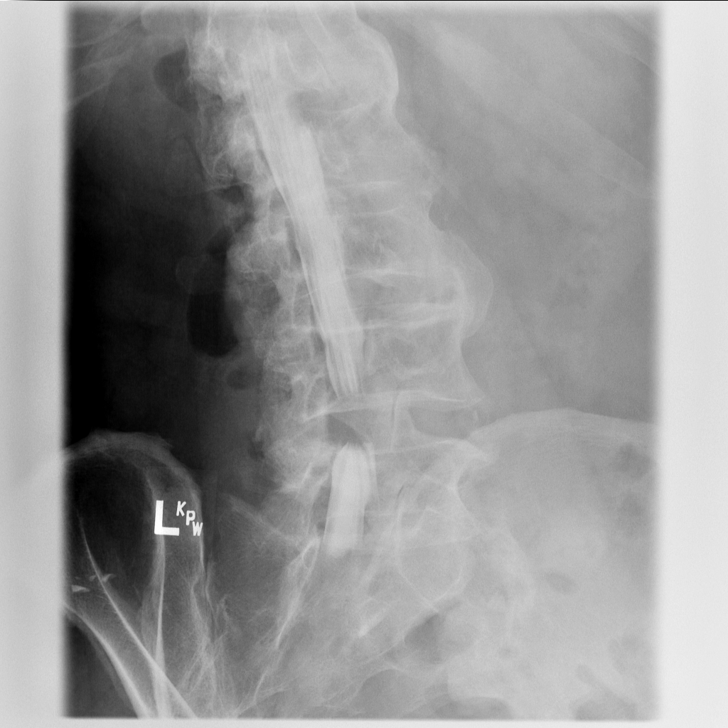

[Series 7: fluoro_myelogram_singleshot_bw · 0.20mm/px · 1 of 1 slices shown (7 of 8)]
[im 1/1]
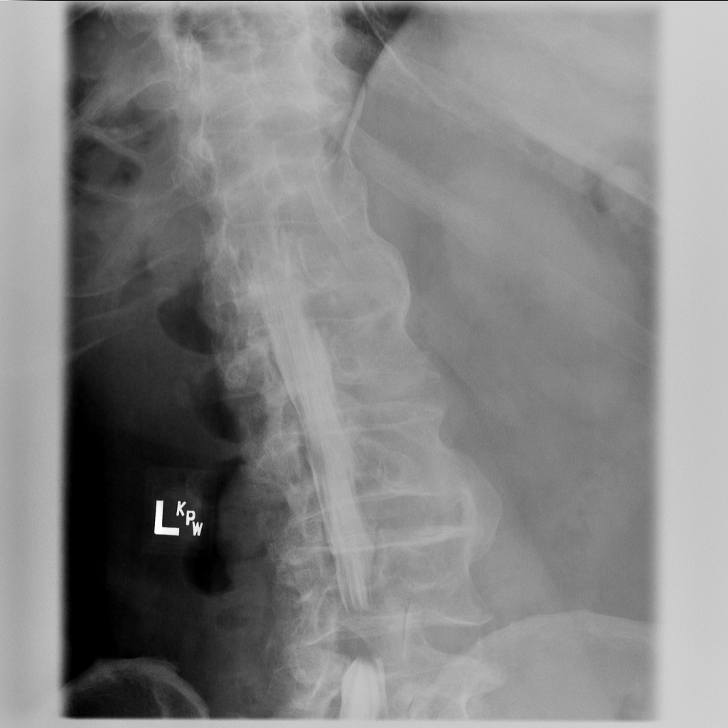

[Series 8: fluoro_myelogram_singleshot_bw · 0.18mm/px · 1 of 1 slices shown (8 of 8)]
[im 1/1]
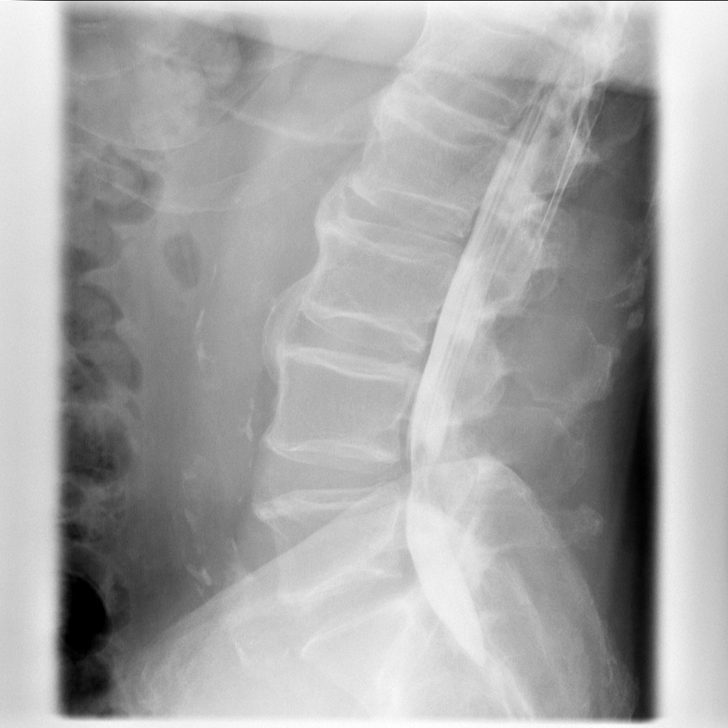

[8 of 8 positions shown; findings below may reference images not displayed]

EXAM:
LUMBAR MYELOGRAM

FLUOROSCOPY TIME:  2 minutes 18 seconds.  Radiation dose 100.8 mGy.

PROCEDURE:
After thorough discussion of risks and benefits of the procedure
including bleeding, infection, injury to nerves, blood vessels,
adjacent structures as well as headache and CSF leak, written and
oral informed consent was obtained. Consent was obtained by Dr.
Quirijn Amazigh. Time out form was completed.

Patient was positioned prone on the fluoroscopy table. Local
anesthesia was provided with 1% lidocaine without epinephrine after
prepped and draped in the usual sterile fashion. Puncture was
performed at L5-S1 using a 3 1/2 inch 22-gauge spinal needle via (_
left paramedian) approach. Using a single pass through the dura, the
needle was placed within the thecal sac, with return of clear CSF.
13 cc of Omnipaque 180 was injected into the thecal sac, with normal
opacification of the nerve roots and cauda equina consistent with
free flow within the subarachnoid space.

I personally performed the lumbar puncture and administered the
intrathecal contrast. I also personally supervised acquisition of
the myelogram images.
FINDINGS: LUMBAR MYELOGRAM FINDINGS:

Successful lumbar myelography for CT myelogram. Lumbar spine
numbered with the lowest segmented appearing lumbar shaped vertebrae
as L5. Prominent epidural defect noted at L4-L5. Reference is made
to post CT report for full discussion.

CT LUMBAR MYELOGRAM FINDINGS:

Reference is made to CT lumbar myelogram report.
IMPRESSION: LUMBAR MYELOGRAM IMPRESSION:

Successful lumbar myelography for CT myelogram. Prominent epidural
defect noted L4-L5. Reference is made to post CT report for full
discussion.

CT LUMBAR MYELOGRAM IMPRESSION:

Reference is made to CT lumbar myelogram report.

## 2021-06-25 ENCOUNTER — Other Ambulatory Visit: Payer: Self-pay

## 2021-06-25 ENCOUNTER — Observation Stay
Admission: EM | Admit: 2021-06-25 | Discharge: 2021-06-27 | Disposition: A | Discharge status: Home / Self Care | Payer: Medicare Other | Attending: Internal Medicine | Admitting: Internal Medicine

## 2021-06-25 ENCOUNTER — Emergency Department: Payer: Medicare Other

## 2021-06-25 DIAGNOSIS — R42 Dizziness and giddiness: Secondary | ICD-10-CM

## 2021-06-25 DIAGNOSIS — Z7901 Long term (current) use of anticoagulants: Secondary | ICD-10-CM

## 2021-06-25 DIAGNOSIS — Z86718 Personal history of other venous thrombosis and embolism: Secondary | ICD-10-CM | POA: Diagnosis not present

## 2021-06-25 DIAGNOSIS — F17290 Nicotine dependence, other tobacco product, uncomplicated: Secondary | ICD-10-CM | POA: Diagnosis not present

## 2021-06-25 DIAGNOSIS — Z79899 Other long term (current) drug therapy: Secondary | ICD-10-CM | POA: Diagnosis not present

## 2021-06-25 DIAGNOSIS — Z85828 Personal history of other malignant neoplasm of skin: Secondary | ICD-10-CM | POA: Diagnosis not present

## 2021-06-25 DIAGNOSIS — D6851 Activated protein C resistance: Secondary | ICD-10-CM | POA: Diagnosis present

## 2021-06-25 DIAGNOSIS — H8111 Benign paroxysmal vertigo, right ear: Secondary | ICD-10-CM | POA: Diagnosis not present

## 2021-06-25 DIAGNOSIS — I10 Essential (primary) hypertension: Secondary | ICD-10-CM | POA: Diagnosis present

## 2021-06-25 DIAGNOSIS — Z20822 Contact with and (suspected) exposure to covid-19: Secondary | ICD-10-CM | POA: Diagnosis not present

## 2021-06-25 DIAGNOSIS — H8110 Benign paroxysmal vertigo, unspecified ear: Secondary | ICD-10-CM | POA: Diagnosis present

## 2021-06-25 DIAGNOSIS — F419 Anxiety disorder, unspecified: Secondary | ICD-10-CM | POA: Diagnosis present

## 2021-06-25 DIAGNOSIS — Z9189 Other specified personal risk factors, not elsewhere classified: Secondary | ICD-10-CM

## 2021-06-25 LAB — CBC WITH DIFFERENTIAL/PLATELET
Abs Immature Granulocytes: 0.01 10*3/uL (ref 0.00–0.07)
Basophils Absolute: 0 10*3/uL (ref 0.0–0.1)
Basophils Relative: 1 %
Eosinophils Absolute: 0.1 10*3/uL (ref 0.0–0.5)
Eosinophils Relative: 1 %
HCT: 48.7 % (ref 39.0–52.0)
Hemoglobin: 16.3 g/dL (ref 13.0–17.0)
Immature Granulocytes: 0 %
Lymphocytes Relative: 20 %
Lymphs Abs: 1.1 10*3/uL (ref 0.7–4.0)
MCH: 33.1 pg (ref 26.0–34.0)
MCHC: 33.5 g/dL (ref 30.0–36.0)
MCV: 99 fL (ref 80.0–100.0)
Monocytes Absolute: 0.6 10*3/uL (ref 0.1–1.0)
Monocytes Relative: 11 %
Neutro Abs: 3.7 10*3/uL (ref 1.7–7.7)
Neutrophils Relative %: 67 %
Platelets: 268 10*3/uL (ref 150–400)
RBC: 4.92 MIL/uL (ref 4.22–5.81)
RDW: 12.9 % (ref 11.5–15.5)
WBC: 5.4 10*3/uL (ref 4.0–10.5)
nRBC: 0 % (ref 0.0–0.2)

## 2021-06-25 LAB — COMPREHENSIVE METABOLIC PANEL
ALT: 20 U/L (ref 0–44)
AST: 25 U/L (ref 15–41)
Albumin: 4.5 g/dL (ref 3.5–5.0)
Alkaline Phosphatase: 82 U/L (ref 38–126)
Anion gap: 10 (ref 5–15)
BUN: 12 mg/dL (ref 8–23)
CO2: 25 mmol/L (ref 22–32)
Calcium: 9.4 mg/dL (ref 8.9–10.3)
Chloride: 106 mmol/L (ref 98–111)
Creatinine, Ser: 1.04 mg/dL (ref 0.61–1.24)
GFR, Estimated: >60 mL/min (ref >60)
Glucose, Bld: 101 mg/dL — ABNORMAL HIGH (ref 70–99)
Potassium: 5 mmol/L (ref 3.5–5.1)
Sodium: 141 mmol/L (ref 135–145)
Total Bilirubin: 1.3 mg/dL — ABNORMAL HIGH (ref 0.3–1.2)
Total Protein: 7.6 g/dL (ref 6.5–8.1)

## 2021-06-25 LAB — TROPONIN I (HIGH SENSITIVITY)
Troponin I (High Sensitivity): 6 ng/L (ref <18)
Troponin I (High Sensitivity): 6 ng/L (ref <18)

## 2021-06-25 LAB — URINALYSIS, ROUTINE W REFLEX MICROSCOPIC
Bacteria, UA: NONE SEEN
Bilirubin Urine: NEGATIVE
Glucose, UA: NEGATIVE mg/dL
Hgb urine dipstick: NEGATIVE
Ketones, ur: 20 mg/dL — AB
Nitrite: NEGATIVE
Protein, ur: NEGATIVE mg/dL
Specific Gravity, Urine: 1.016 (ref 1.005–1.030)
Squamous Epithelial / HPF: NONE SEEN (ref 0–5)
pH: 5 (ref 5.0–8.0)

## 2021-06-25 LAB — RESP PANEL BY RT-PCR (FLU A&B, COVID) ARPGX2
Influenza A by PCR: NEGATIVE
Influenza B by PCR: NEGATIVE
SARS Coronavirus 2 by RT PCR: NEGATIVE

## 2021-06-25 LAB — CBG MONITORING, ED: Glucose-Capillary: 97 mg/dL (ref 70–99)

## 2021-06-25 MED ORDER — LACTATED RINGERS IV BOLUS
1000.0000 mL | Freq: Once | INTRAVENOUS | Status: AC
  Administered 2021-06-26: 1000 mL via INTRAVENOUS

## 2021-06-25 MED ORDER — MECLIZINE HCL 25 MG PO TABS
25.0000 mg | ORAL_TABLET | Freq: Once | ORAL | Status: AC
  Administered 2021-06-26: 25 mg via ORAL
  Filled 2021-06-25: qty 1

## 2021-06-25 NOTE — ED Provider Triage Note (Signed)
  Emergency Medicine Provider Triage Evaluation Note  Elijah Lopez. , a 81 y.o.male,  was evaluated in triage.  Pt complains of weakness and dizziness x1 week.  He states that he just does not feel right.  In addition endorses cough as well.   Review of Systems  Positive: Cough, dizziness, weakness. Negative: Denies fever, chest pain, vomiting  Physical Exam   Vitals:   06/25/21 1743  BP: (!) 160/96  Pulse: (!) 104  Resp: 16  Temp: 98 F (36.7 C)  SpO2: 94%   Gen:   Awake, appears unwell. Resp:  Normal effort  MSK:   Moves extremities without difficulty  Other:    Medical Decision Making  Given the patient's initial medical screening exam, the following diagnostic evaluation has been ordered. The patient will be placed in the appropriate treatment space, once one is available, to complete the evaluation and treatment. I have discussed the plan of care with the patient and I have advised the patient that an ED physician or mid-level practitioner will reevaluate their condition after the test results have been received, as the results may give them additional insight into the type of treatment they may need.    Diagnostics: Labs, EKG, CXR, UA  Treatments: none immediately   Teodoro Spray, [place] 06/25/21 1751

## 2021-06-25 NOTE — ED Notes (Signed)
First nurse note-pt alert, sitting in wheelchair.  Family with pt   pt has dizziness.  Skin warm and dry.

## 2021-06-25 NOTE — ED Notes (Signed)
Pt states he has not has his BP meds today.  Pt states the tremor tech noticed is new due to his symptoms.  Pt states he does have vertigo and sees a Dr. For this. PT states his ears are bothering him and the room is spinning

## 2021-06-25 NOTE — ED Triage Notes (Addendum)
Pt to ED for ongoing dizziness since over 1 week ago. States has been unable to get out of bed and feels weak. Hx of positional vertigo. 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 States "I just don't feel right, I think something's wrong with me". Denies chest pain, SOB, fevers, NVD. Endorses slight cough.  States felt like was going to pass out earlier today but so far has not fainted. Had to try 4 times to get OOB this morning because kept getting dizzy and room was spinning  Pt also states when asked about vision changes that has been seeing black spots in visual fields to L eye since about 3 hours ago.  Provider at bedside. Performing EKG at this time.   Pt takes Xarelto for factor 5.

## 2021-06-26 ENCOUNTER — Other Ambulatory Visit: Payer: Self-pay

## 2021-06-26 DIAGNOSIS — H8111 Benign paroxysmal vertigo, right ear: Secondary | ICD-10-CM | POA: Diagnosis not present

## 2021-06-26 DIAGNOSIS — Z9189 Other specified personal risk factors, not elsewhere classified: Secondary | ICD-10-CM

## 2021-06-26 DIAGNOSIS — R42 Dizziness and giddiness: Secondary | ICD-10-CM | POA: Diagnosis not present

## 2021-06-26 DIAGNOSIS — H8110 Benign paroxysmal vertigo, unspecified ear: Secondary | ICD-10-CM | POA: Diagnosis present

## 2021-06-26 LAB — CBG MONITORING, ED: Glucose-Capillary: 96 mg/dL (ref 70–99)

## 2021-06-26 MED ORDER — RIVAROXABAN 10 MG PO TABS
10.0000 mg | ORAL_TABLET | Freq: Every evening | ORAL | Status: DC
  Administered 2021-06-26: 10 mg via ORAL
  Filled 2021-06-26 (×2): qty 1

## 2021-06-26 MED ORDER — ONDANSETRON HCL 4 MG PO TABS
4.0000 mg | ORAL_TABLET | Freq: Four times a day (QID) | ORAL | Status: DC | PRN
  Administered 2021-06-26: 4 mg via ORAL
  Filled 2021-06-26: qty 1

## 2021-06-26 MED ORDER — CLONAZEPAM 0.5 MG PO TABS
0.5000 mg | ORAL_TABLET | Freq: Once | ORAL | Status: AC
Start: 2021-06-26 — End: 2021-06-26
  Administered 2021-06-26: 0.5 mg via ORAL
  Filled 2021-06-26: qty 1

## 2021-06-26 MED ORDER — LISINOPRIL 5 MG PO TABS
7.5000 mg | ORAL_TABLET | Freq: Every day | ORAL | Status: DC
  Administered 2021-06-26: 7.5 mg via ORAL
  Filled 2021-06-26: qty 2

## 2021-06-26 MED ORDER — MECLIZINE HCL 25 MG PO TABS
25.0000 mg | ORAL_TABLET | Freq: Three times a day (TID) | ORAL | Status: DC
  Administered 2021-06-26: 25 mg via ORAL
  Filled 2021-06-26 (×2): qty 1

## 2021-06-26 MED ORDER — CLONAZEPAM 0.5 MG PO TABS
0.5000 mg | ORAL_TABLET | Freq: Two times a day (BID) | ORAL | Status: DC | PRN
  Administered 2021-06-26: 1 mg via ORAL
  Filled 2021-06-26: qty 2

## 2021-06-26 MED ORDER — PANTOPRAZOLE SODIUM 40 MG PO TBEC
40.0000 mg | DELAYED_RELEASE_TABLET | Freq: Every day | ORAL | Status: DC
  Administered 2021-06-26 – 2021-06-27 (×2): 40 mg via ORAL
  Filled 2021-06-26 (×2): qty 1

## 2021-06-26 MED ORDER — MECLIZINE HCL 25 MG PO TABS: 25.0000 mg | ORAL_TABLET | Freq: Three times a day (TID) | ORAL | Status: DC | PRN

## 2021-06-26 MED ORDER — CLONAZEPAM 1 MG PO TABS
1.0000 mg | ORAL_TABLET | Freq: Three times a day (TID) | ORAL | Status: DC
  Administered 2021-06-26: 1 mg via ORAL
  Filled 2021-06-26: qty 1

## 2021-06-26 MED ORDER — ONDANSETRON HCL 4 MG/2ML IJ SOLN: 4.0000 mg | Freq: Four times a day (QID) | INTRAMUSCULAR | Status: DC | PRN

## 2021-06-26 MED ORDER — ACETAMINOPHEN 650 MG RE SUPP: 650.0000 mg | Freq: Four times a day (QID) | RECTAL | Status: DC | PRN

## 2021-06-26 MED ORDER — ACETAMINOPHEN 325 MG PO TABS
650.0000 mg | ORAL_TABLET | Freq: Four times a day (QID) | ORAL | Status: DC | PRN
  Administered 2021-06-27: 650 mg via ORAL
  Filled 2021-06-26: qty 2

## 2021-06-26 MED ORDER — AMLODIPINE BESYLATE 5 MG PO TABS
5.0000 mg | ORAL_TABLET | Freq: Every day | ORAL | Status: DC
  Administered 2021-06-26: 5 mg via ORAL
  Filled 2021-06-26: qty 1

## 2021-06-26 MED ORDER — MECLIZINE HCL 25 MG PO TABS
25.0000 mg | ORAL_TABLET | Freq: Three times a day (TID) | ORAL | Status: DC | PRN
Start: 2021-06-26 — End: 2021-06-27

## 2021-06-26 NOTE — Progress Notes (Signed)
[facility] PLAN OF CARE NOTE Patient: Elijah Lopez. NGI:[id number]   PCP: Tereasa Coop, PA-C DOB: Dec 02, 1940   DOA: 06/25/2021   DOS: 06/26/2021    Patient was admitted by my colleague earlier on 06/26/2021. I have reviewed the H&P as well as assessment and plan and agree with the same. Important changes in the plan are listed below.  Plan of care: Principal Problem:   Vertigo Active Problems:   Anticoagulant long-term use   Factor 5 Leiden mutation, 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HCC)   Anxiety   HTN (hypertension)   At risk for allergic reaction to medication   BPPV. Appreciate vestibular rehab PT. Symptoms improving. We will continue scheduled Klonopin at a higher frequency of 3 times daily. Outpatient follow-up with ENT.  No indication for inpatient consult.  Discussed with ENT.  Author: Berle Mull, MD [facility] 06/26/2021 5:13 PM   If 7PM-7AM, please contact night-coverage at [email or web address]

## 2021-06-26 NOTE — Assessment & Plan Note (Signed)
-  Continue Klonopin °

## 2021-06-26 NOTE — Assessment & Plan Note (Signed)
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

## 2021-06-26 NOTE — Progress Notes (Signed)
Physical Therapy Treatment Patient Details Name: Elijah Lopez. MRN: [id number] DOB: 08-04-1940 Today's Date: 06/26/2021   History of Present Illness Elijah Lopez. is an 24yoM who comes to [facility] on 5/30 after nearly 1 week of ongoing transient episodic vertigo associate with bed mobility. Pt reports episodes last less than 60sec, but are vertiginous in nature, no significant nause/vomitting, but he has essentially been immobilized by this. He denies any symptoms provocation once up and walking, but continued to feel weak in general. Pt reports  a similar issue 10 years prior. 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 Pt denies any recent signs/symptoms of sinus or ear infection, no recent changes to hearing or tinnitus. Pt follows with ENT regularly. No recent falls. Pt does not AMB with device at baseline.    PT Comments    Pt in bed on arrivlal, reports he has since received antiemetics and they are working well. He is begging to feel better. Agreeable to further assessment of AMB and BP. Orthostatics flat, brief mild dizziness without vertigo with each transition, good resolution. Pt has no dizziness while AMB. Pt AMB >241f with RW, surprised at how good he is moving now, then finished the remaining distance to room without device (as per baseline) again moving better than he anticipated, no LOB, no frank unsteadiness. Pt in bed at end of session. Educated on [place]   Orthostatic VS for the past 24 hrs:  BP- Lying Pulse- Lying BP- Sitting Pulse- Sitting BP- Standing at 0 minutes Pulse- Standing at 0 minutes  06/26/21 1410 114/77 87 120/83 100 115/83 87       Recommendations for follow up therapy are one component of a multi-disciplinary discharge planning process, led by the attending physician.  Recommendations may be updated based on patient status, additional functional criteria and insurance  authorization.  Follow Up Recommendations  Outpatient PT     Assistance Recommended at Discharge Set up Supervision/Assistance  Patient can return home with the following     Equipment Recommendations  None recommended by PT    Recommendations for Other Services       Precautions / Restrictions Precautions Precautions: Fall Restrictions Weight Bearing Restrictions: No     Mobility  Bed Mobility Overal bed mobility: Modified Independent Bed Mobility: Supine to Sit, Sit to Supine     Supine to sit: Modified independent (Device/Increase time) Sit to supine: Modified independent (Device/Increase time)   General bed mobility comments: generally good, but history of back surgery, struggles with ability and tolerance of Epley Maneuver and requires considerable assistance    Transfers Overall transfer level: Needs assistance Equipment used: Rolling walker (2 wheels) Transfers: Sit to/from Stand Sit to Stand: Supervision                Ambulation/Gait Ambulation/Gait assistance: Supervision Gait Distance (Feet): 280 Feet Assistive device: Rolling walker (2 wheels) (no device for final 763f Gait Pattern/deviations: WFL(Within Functional Limits) Gait velocity: 0.4848m    General Gait Details: per pt fairly close to baseline   Stairs             Wheelchair Mobility    Modified Rankin (Stroke Patients Only)       Balance  Cognition Arousal/Alertness: Awake/alert Behavior During Therapy: WFL for tasks assessed/performed Overall Cognitive Status: Within Functional Limits for tasks assessed                                          Exercises      General Comments        Pertinent Vitals/Pain Pain Assessment Pain Assessment: No/denies pain    Home Living Family/patient expects to be discharged to:: Private residence Living Arrangements: Spouse/significant  other Available Help at Discharge: Family Type of Home: House Home Access: Stairs to enter Entrance Stairs-Rails: Chemical engineer of Steps: 2   Home Layout: One level Home Equipment: Conservation officer, nature (2 wheels);Cane - single point      Prior Function            PT Goals (current goals can now be found in the care plan section) Acute Rehab PT Goals Patient Stated Goal: improve dizziness, regain tolerance to basic mobility PT Goal Formulation: With patient Time For Goal Achievement: 07/10/21 Potential to Achieve Goals: Fair Progress towards PT goals: Progressing toward goals    Frequency    Min 2X/week      PT Plan Current plan remains appropriate    Co-evaluation              AM-PAC PT "6 Clicks" Mobility   Outcome Measure  Help needed turning from your back to your side while in a flat bed without using bedrails?: None Help needed moving from lying on your back to sitting on the side of a flat bed without using bedrails?: None Help needed moving to and from a bed to a chair (including a wheelchair)?: A Little Help needed standing up from a chair using your arms (e.g., wheelchair or bedside chair)?: A Little Help needed to walk in hospital room?: A Little Help needed climbing 3-5 steps with a railing? : A Little 6 Click Score: 20    End of Session Equipment Utilized During Treatment: Gait belt Activity Tolerance: Patient tolerated treatment well;No increased pain Patient left: in bed;with call bell/phone within reach;with bed alarm set Nurse Communication: Mobility status PT Visit Diagnosis: Dizziness and giddiness (R42);BPPV BPPV - Right/Left : Right     Time: 0932-6712 PT Time Calculation (min) (ACUTE ONLY): 18 min  Charges:  $Therapeutic Exercise: 8-22 mins $Canalith Rep Proc: 8-22 mins                    4:28 PM, 06/26/21 Etta Grandchild, PT, DPT Physical Therapist - [facility]  [phone]  ([place])     [place] [facility] 06/26/2021, 4:25 PM

## 2021-06-26 NOTE — ED Notes (Signed)
Pt given graham crackers, peanut butter, and orange juice and was encouraged to eat

## 2021-06-26 NOTE — Assessment & Plan Note (Signed)
Patient has multiple medication allergies

## 2021-06-26 NOTE — Progress Notes (Signed)
Admission profile updated. ?

## 2021-06-26 NOTE — Assessment & Plan Note (Signed)
Continue Xarelto 

## 2021-06-26 NOTE — Assessment & Plan Note (Signed)
-  Continue amlodipine and lisinopril 

## 2021-06-26 NOTE — H&P (Addendum)
History and Physical    Patient: Elijah Lopez. VZD:[id number] DOB: Jan 25, 1941 DOA: 06/25/2021 DOS: the patient was seen and examined on 06/26/2021 PCP: Tereasa Coop, PA-C  Patient coming from: Home  Chief Complaint:  Chief Complaint  Patient presents with   Dizziness    HPI: Elijah Lopez. is a 81 y.o. 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  States it feels similar to an episode of vertigo he had several years prior for which she saw his ENT specialist.  The symptoms have been worsening over the past 3 days.  He tried calling his ENT specialist but he was out of town.  He denies fever, chills, cough or congestion.  Denies headache, visual disturbance, slurred speech, one-sided weakness numbness or tingling. ED course and data review: On arrival afebrile, tachycardic to 104 with BP 160/96 and otherwise normal vitals: Labs including CMP, CBC, troponin mostly unremarkable.  COVID and flu negative.  EKG, personally viewed and interpreted showing sinus tachycardia at 108 with nonspecific ST-T wave changes.  Chest x-ray with mild bibasilar atelectasis and head CT with no acute intracranial abnormality. Patient treated with Klonopin and meclizine and given a fluid bolus.  Hospitalist consulted for admission.   Review of Systems: As mentioned in the history of present illness. All other systems reviewed and are negative.  Past Medical History:  Diagnosis Date   Amnesia, global, transient    Anxiety    Blood dyscrasia    BPH (benign prostatic hyperplasia)    Cancer (HCC)    skin   DVT (deep venous thrombosis) (HCC)    Factor 5 Leiden mutation, 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HCC)    Hematuria    History  of kidney stones    Hypertension    Monoclonal gammopathy    Monoclonal gammopathy of unknown significance (MGUS)    Monoclonal paraproteinemia    Tinnitus    Past Surgical History:  Procedure Laterality Date   APPENDECTOMY     CHOLECYSTECTOMY     6/17   COLON SURGERY     ELBOW SURGERY     HAND SURGERY     INNER EAR SURGERY Right    prosthetic   IVC FILTER PLACEMENT ([facility] HX)     IVC FILTER PLACEMENT ([facility] HX)     retrivial   MYRINGOTOMY WITH TUBE PLACEMENT Left 11/22/2015   Procedure: MYRINGOTOMY WITH TUBE PLACEMENT;  Surgeon: Carloyn Manner, MD;  Location: [facility] ORS;  Service: ENT;  Laterality: Left;   MYRINGOTOMY WITH TUBE PLACEMENT Left 03/14/2020   Procedure: MYRINGOTOMY WITH BUTTERFLY TUBE PLACEMENT;  Surgeon: Carloyn Manner, MD;  Location: [place];  Service: ENT;  Laterality: Left;   SHOULDER SURGERY Left    has broken pin   Social History:  reports that he has been smoking cigars. He has never used smokeless tobacco. He reports current alcohol use. He reports that he does not use drugs.  Allergies  Allergen Reactions   Ceftin [Cefuroxime Axetil] Swelling   Trazodone And Nefazodone Anaphylaxis    ams   Eryc [Erythromycin] Other (See Comments)    Other reaction(s): Other (See Comments) Other Reaction: OTHER REACTION-"SICK" Feels sick   Sulfamethoxazole Hives   Dilaudid [Hydromorphone Hcl] Nausea  And Vomiting   Elavil [Amitriptyline Hcl] Nausea And Vomiting   Hctz [Hydrochlorothiazide]     Other reaction(s): Other (See Comments) "cant function, can't walk, blood pressure plummeted" Can't walk/bp down   Percodan [Oxycodone-Aspirin] Nausea And Vomiting   Piroxicam Other (See Comments)    Other reaction(s): UNKNOWN unknown   Promethazine Other (See Comments)    Unknown unknown   Simvastatin Other (See Comments)    Muscle pain   Triamterene Other (See Comments)    UNKNOWN unknown   Tussionex Pennkinetic Er [Hydrocod Poli-Chlorphe Poli Er]  Nausea And Vomiting    sweating   Amitriptyline Nausea And Vomiting   Amoxapine And Related Rash   Amoxicillin Rash   Ativan [Lorazepam] Nausea And Vomiting    "can't function"   Codeine Nausea And Vomiting   Erythromycin Base Nausea And Vomiting and Rash   Gabapentin Nausea And Vomiting   Hydromorphone Nausea And Vomiting   Lecithin Nausea And Vomiting   Naproxen Nausea And Vomiting and Rash   Naproxen Sodium Nausea And Vomiting and Rash   Nortriptyline Nausea And Vomiting   Nsaids Nausea And Vomiting and Rash   Orudis [Ketoprofen] Rash   Oxycodone-Acetaminophen Nausea And Vomiting and Palpitations    Sweating   Sertraline Anxiety    "MAKES ME FEEL LIKE IM OUT OF IT"   Sonata [Zaleplon] Anxiety    Insomnia   Zoloft [Sertraline Hcl] Anxiety    Family History  Problem Relation Age of Onset   Other Mother 62       "natural causes"   Hypertension Mother    Diverticulitis Mother    Heart attack Father 76    Prior to Admission medications   Medication Sig Start Date End Date Taking? Authorizing Provider  Acetaminophen (TYLENOL 8 HOUR PO) Take by mouth. As needed    [provider]  amLODipine (NORVASC) 5 MG tablet Take 5 mg by mouth daily. 1/2 tab am and pm    [provider]  Cholecalciferol (VITAMIN D3) 1000 units CAPS Take by mouth.    [provider]  clonazePAM (KLONOPIN) 1 MG tablet Take 1 mg by mouth 3 (three) times daily.    [provider]  lisinopril (PRINIVIL,ZESTRIL) 5 MG tablet Take 7.5 mg by mouth daily.    [provider]  omeprazole (PRILOSEC OTC) 20 MG tablet Take by mouth. 04/04/13   [provider]  rivaroxaban (XARELTO) 10 MG TABS tablet Take 10 mg by mouth daily. pm    [provider]  sodium fluoride (FLUORISHIELD) 1.1 % GEL dental gel  04/17/13   [provider]    Physical Exam: Vitals:   06/26/21 0130 06/26/21 0200 06/26/21 0230 06/26/21 0340  BP: 132/85 (!) 153/96 126/83 140/88   Pulse: 89 89 80 100  Resp: '19 17 16 18  '$ Temp:      TempSrc:      SpO2: 97% 98% 97% 98%  Weight:      Height:       Physical Exam Vitals and nursing note reviewed.  Constitutional:      General: He is not in acute distress. HENT:     Head: Normocephalic and atraumatic.  Cardiovascular:     Rate and Rhythm: Normal rate and regular rhythm.     Heart sounds: Normal heart sounds.  Pulmonary:     Effort: Pulmonary effort is normal.     Breath sounds: Normal breath sounds.  Abdominal:     Palpations: Abdomen is soft.  Tenderness: There is no abdominal tenderness.  Neurological:     Mental Status: Mental status is at baseline.    Labs on Admission: I have personally reviewed following labs and imaging studies  CBC: Recent Labs  Lab 06/25/21 1745  WBC 5.4  NEUTROABS 3.7  HGB 16.3  HCT 48.7  MCV 99.0  PLT 956   Basic Metabolic Panel: Recent Labs  Lab 06/25/21 1745  NA 141  K 5.0  CL 106  CO2 25  GLUCOSE 101*  BUN 12  CREATININE 1.04  CALCIUM 9.4   GFR: Estimated Creatinine Clearance: 61.6 mL/min (by C-G formula based on SCr of 1.04 mg/dL). Liver Function Tests: Recent Labs  Lab 06/25/21 1745  AST 25  ALT 20  ALKPHOS 82  BILITOT 1.3*  PROT 7.6  ALBUMIN 4.5   No results for input(s): LIPASE, AMYLASE in the last 168 hours. No results for input(s): AMMONIA in the last 168 hours. Coagulation Profile: No results for input(s): INR, PROTIME in the last 168 hours. Cardiac Enzymes: No results for input(s): CKTOTAL, CKMB, CKMBINDEX, TROPONINI in the last 168 hours. BNP (last 3 results) No results for input(s): PROBNP in the last 8760 hours. HbA1C: No results for input(s): HGBA1C in the last 72 hours. CBG: Recent Labs  Lab 06/25/21 1749 06/26/21 0253  GLUCAP 97 96   Lipid Profile: No results for input(s): CHOL, HDL, LDLCALC, TRIG, CHOLHDL, LDLDIRECT in the last 72 hours. Thyroid Function Tests: No results for input(s): TSH, T4TOTAL, FREET4, T3FREE,  THYROIDAB in the last 72 hours. Anemia Panel: No results for input(s): VITAMINB12, FOLATE, FERRITIN, TIBC, IRON, RETICCTPCT in the last 72 hours. Urine analysis:    Component Value Date/Time   COLORURINE YELLOW (A) 06/25/2021 2045   APPEARANCEUR CLEAR (A) 06/25/2021 2045   APPEARANCEUR Turbid 07/16/2011 1728   LABSPEC 1.016 06/25/2021 2045   LABSPEC 1.027 07/16/2011 1728   PHURINE 5.0 06/25/2021 2045   GLUCOSEU NEGATIVE 06/25/2021 2045   GLUCOSEU Negative 07/16/2011 1728   HGBUR NEGATIVE 06/25/2021 2045   BILIRUBINUR NEGATIVE 06/25/2021 2045   BILIRUBINUR Negative 07/16/2011 1728   KETONESUR 20 (A) 06/25/2021 2045   PROTEINUR NEGATIVE 06/25/2021 2045   NITRITE NEGATIVE 06/25/2021 2045   LEUKOCYTESUR TRACE (A) 06/25/2021 2045   LEUKOCYTESUR Negative 07/16/2011 1728    Radiological Exams on Admission: DG Chest 1 View  Result Date: 06/25/2021 CLINICAL DATA:  Weakness EXAM: CHEST  1 VIEW COMPARISON:  None Available. FINDINGS: Heart size and vascularity normal. Mild atelectasis in the lung bases. Negative for pneumonia or effusion. IMPRESSION: Mild bibasilar atelectasis. Electronically Signed   By: Franchot Gallo M.D.   On: 06/25/2021 18:14   CT Head Wo Contrast  Result Date: 06/25/2021 CLINICAL DATA:  Dizziness, non-specific EXAM: CT HEAD WITHOUT CONTRAST TECHNIQUE: Contiguous axial images were obtained from the base of the skull through the vertex without intravenous contrast. RADIATION DOSE REDUCTION: This exam was performed according to the departmental dose-optimization program which includes automated exposure control, adjustment of the mA and/or kV according to patient size and/or use of iterative reconstruction technique. COMPARISON:  None Available. BRAIN: BRAIN Patchy and confluent areas of decreased attenuation are noted throughout the deep and periventricular white matter of the cerebral hemispheres bilaterally, compatible with chronic microvascular ischemic disease. No evidence  of large-territorial acute infarction. No parenchymal hemorrhage. No mass lesion. No extra-axial collection. No mass effect or midline shift. No hydrocephalus. Basilar cisterns are patent. Vascular: No hyperdense vessel. Skull: No acute fracture or focal lesion. Sinuses/Orbits: Paranasal sinuses  and mastoid air cells are clear. The orbits are unremarkable. Other: None. IMPRESSION: No acute intracranial abnormality. Electronically Signed   By: Iven Finn M.D.   On: 06/25/2021 21:28     Data Reviewed: Relevant notes from primary care and specialist visits, past discharge summaries as available in EHR, including Care Everywhere. Prior diagnostic testing as pertinent to current admission diagnoses Updated medications and problem lists for reconciliation ED course, including vitals, labs, imaging, treatment and response to treatment Triage notes, nursing and pharmacy notes and ED provider's notes Notable results as noted in HPI   Assessment and Plan: * Vertigo Patient with prior episodes of vertigo followed by ENT Lower suspicion for central vertigo given the patient is on Xarelto Unable to get MRI due to right implant Symptomatic treatment with meclizine.  Patient would prefer to not to try steroids We will start with ENT consult.  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  Anxiety Continue Klonopin        DVT prophylaxis: On Xarelto  Consults: ENT, Dr. Richardson Landry  Advance Care Planning:   Code Status: Not on file full  Family Communication: none  Disposition Plan: Back to previous home environment  Severity of Illness: The appropriate patient status for this patient is  OBSERVATION. Observation status is judged to be reasonable and necessary in order to provide the required intensity of service to ensure the patient's safety. The patient's presenting symptoms, physical exam findings, and initial radiographic and laboratory data in the context of their medical condition is felt to place them at decreased risk for further clinical deterioration. Furthermore, it is anticipated that the patient will be medically stable for discharge from the hospital within 2 midnights of admission.   Author: Athena Masse, MD 06/26/2021 4:25 AM  For on call review [email or web address].

## 2021-06-26 NOTE — Evaluation (Addendum)
Physical Therapy Evaluation Patient Details Name: Elijah Lopez. MRN: [id number] DOB: 1940-09-08 Today's Date: 06/26/2021  History of Present Illness  Elijah Lopez. is an 49yoM who comes to [facility] on 5/30 after nearly 1 week of ongoing transient episodic vertigo associate with bed mobility. Pt reports episodes last less than 60sec, but are vertiginous in nature, no significant nause/vomitting, but he has essentially been immobilized by this. He denies any symptoms provocation once up and walking, but continued to feel weak in general. Pt reports  a similar issue 10 years prior. 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 Pt denies any recent signs/symptoms of sinus or ear infection, no recent changes to hearing or tinnitus. Pt follows with ENT regularly. No recent falls. Pt does not AMB with device at baseline.  Clinical Impression  Pt admitted c above Dx. Pt shows functional limitations due to the deficits listed below (see "PT Problem List"). Patient agreeable to PT evaluation. PLOF and home setup obtained. Pt able to perform bed mobility, transfers, and AMB with greater than typical effort, but without physical assistance. Pt's testing consistent with anterior canal BPPV, a rarity, but pt showing ageotropic nystagmus with Rt Dix Hallpike only, Left negative, and bilat horizontal canal tests negative. Pt is most symptomatic with coming supine to longsitting. Pt quite nauseated after treatment which is expected, called for antiemetics afterward. Pt assisted to/from BR at end of session. Patient's assessment reveals acute need for additional person for safety and/or physical assistance to complete their typical ADL. At baseline, the patient is able to perform ADL with modified independence. Patient will benefit from skilled PT intervention to maximize independence and safety in mobility required for basic ADL performance at discharge.         Vestibular Assessment -  06/26/21 0001       Symptom Behavior   Type of Dizziness  Vertigo;Spinning    Frequency of Dizziness multiple times per day    Duration of Dizziness <60sec    Symptom Nature Positional    Aggravating Factors Supine to sit;Rolling to right;Rolling to left    Relieving Factors --   waiting for resolution   Progression of Symptoms Worse   ill feelings following episodes is more persistent   History of similar episodes 10 years prior      Oculomotor Exam   Oculomotor Alignment Normal      Positional Testing   Dix-Hallpike Dix-Hallpike Right;Dix-Hallpike Left    Horizontal Canal Testing Horizontal Canal Right;Horizontal Canal Left      Dix-Hallpike Right   Dix-Hallpike Right Duration ~25sec    Dix-Hallpike Right Symptoms Upbeat, left rotatory nystagmus;Upbeat, right rotatory nystagmus   apogeotropic     Dix-Hallpike Left   Dix-Hallpike Left Duration negative test    Dix-Hallpike Left Symptoms No nystagmus      Horizontal Canal Right   Horizontal Canal Right Duration negative test      Horizontal Canal Left   Horizontal Canal Left Duration negative test              Vestibular Treatment/Exercise - 06/26/21 0001       Vestibular Treatment/Exercise   Vestibular Treatment Provided Canalith Repositioning    Canalith Repositioning Epley Manuever Right   Right ear treatment; left trunk rotation      EPLEY MANUEVER RIGHT   Number of Reps  1    Overall Response Symptoms Worsened   nystagmus resolves consistently, pt has remaining nausea  Recommendations for follow up therapy are one component of a multi-disciplinary discharge planning process, led by the attending physician.  Recommendations may be updated based on patient status, additional functional criteria and insurance authorization.  Follow Up Recommendations Outpatient PT (vestibular OPPT at [facility])    Assistance Recommended at Discharge Set up Supervision/Assistance  Patient can  return home with the following       Equipment Recommendations None recommended by PT  Recommendations for Other Services       Functional Status Assessment Patient has had a recent decline in their functional status and demonstrates the ability to make significant improvements in function in a reasonable and predictable amount of time.     Precautions / Restrictions Precautions Precautions: Fall      Mobility  Bed Mobility Overal bed mobility: Needs Assistance Bed Mobility: Supine to Sit, Sit to Supine     Supine to sit: Min assist Sit to supine: Min assist   General bed mobility comments: generally good, but history of back surgery, struggles with ability and tolerance of Epley Maneuver and requires considerable assistance    Transfers Overall transfer level: Needs assistance Equipment used: Rolling walker (2 wheels) Transfers: Sit to/from Stand Sit to Stand: Supervision                Ambulation/Gait Ambulation/Gait assistance: Min guard Gait Distance (Feet): 20 Feet Assistive device: Rolling walker (2 wheels) Gait Pattern/deviations: WFL(Within Functional Limits)       General Gait Details: AMB to/from BR at end of session, no LOB. Able to stand and warsh hands without LOB or subjective unsteadiness.  Stairs            Wheelchair Mobility    Modified Rankin (Stroke Patients Only)       Balance                                             Pertinent Vitals/Pain Pain Assessment Pain Assessment: No/denies pain    Home Living Family/patient expects to be discharged to:: Private residence Living Arrangements: Spouse/significant other Available Help at Discharge: Family Type of Home: House Home Access: Stairs to enter Entrance Stairs-Rails: Chemical engineer of Steps: 2   Home Layout: One level Home Equipment: Conservation officer, nature (2 wheels);Cane - single point      Prior Function Prior Level of Function :  Independent/Modified Independent                     Hand Dominance        Extremity/Trunk Assessment                Communication      Cognition Arousal/Alertness: Awake/alert Behavior During Therapy: WFL for tasks assessed/performed Overall Cognitive Status: Within Functional Limits for tasks assessed                                          General Comments      Exercises     Assessment/Plan    PT Assessment Patient needs continued PT services  PT Problem List Decreased strength;Decreased activity tolerance;Decreased balance;Decreased mobility;Decreased coordination;Decreased safety awareness       PT Treatment Interventions Balance training;Neuromuscular re-education;Functional mobility training;Therapeutic activities    PT Goals (Current goals can  be found in the Care Plan section)  Acute Rehab PT Goals Patient Stated Goal: improve dizziness, regain tolerance to basic mobility PT Goal Formulation: With patient Time For Goal Achievement: 07/10/21 Potential to Achieve Goals: Fair    Frequency Min 2X/week     Co-evaluation               AM-PAC PT "6 Clicks" Mobility  Outcome Measure Help needed turning from your back to your side while in a flat bed without using bedrails?: None Help needed moving from lying on your back to sitting on the side of a flat bed without using bedrails?: None Help needed moving to and from a bed to a chair (including a wheelchair)?: A Little Help needed standing up from a chair using your arms (e.g., wheelchair or bedside chair)?: A Little Help needed to walk in hospital room?: A Little Help needed climbing 3-5 steps with a railing? : A Little 6 Click Score: 20    End of Session Equipment Utilized During Treatment: Gait belt Activity Tolerance: Patient tolerated treatment well;Treatment limited secondary to medical complications (Comment);Patient limited by fatigue (nausea) Patient left: in  bed;with call bell/phone within reach;with bed alarm set Nurse Communication: Mobility status PT Visit Diagnosis: Dizziness and giddiness (R42);BPPV BPPV - Right/Left : Right    Time: 7014-1030 PT Time Calculation (min) (ACUTE ONLY): 40 min   Charges:   PT Evaluation $PT Eval Moderate Complexity: 1 Mod PT Treatments $Canalith Rep Proc: 8-22 mins      4:18 PM, 06/26/21 Etta Grandchild, PT, DPT Physical Therapist - [facility]  [phone] ([place])    [place] C 06/26/2021, 4:11 PM

## 2021-06-26 NOTE — Assessment & Plan Note (Signed)
Patient with prior episodes of vertigo followed by ENT Lower suspicion for central vertigo given the patient is on Xarelto Unable to get MRI due to right implant Symptomatic treatment with meclizine.  Patient would prefer to not to try steroids We will start with ENT consult.  Can consider neuro consult

## 2021-06-26 NOTE — ED Provider Notes (Signed)
[facility] Provider Note    Event Date/Time   First MD Initiated Contact with Patient 06/25/21 2351     (approximate)   History   Dizziness   HPI  Elijah Coaxum. is a 81 y.o. 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  Patient reports that he has felt unwell and fatigued for about a week.  For the last 3 days he has had dizziness.  He describes the dizziness as feeling off balance, room spinning, and feeling lightheaded like he is going to pass out.  The dizziness is worse with movement and better if he lays still.  Today the dizziness was so severe that he was unable to get up from bed.  He has had nausea associated with that but no vomiting.  Has had a mild cough.  No fever or chills, no chest pain or shortness of breath, no headache, diplopia, dysarthria, dysphagia, unilateral weakness or numbness, facial droop, slurred speech.  Patient does report 1 episode of vertigo many years ago but this feels much more severe.     Past Medical History:  Diagnosis Date   Amnesia, global, transient    Anxiety    Blood dyscrasia    BPH (benign prostatic hyperplasia)    Cancer (HCC)    skin   DVT (deep venous thrombosis) (HCC)    Factor 5 Leiden mutation, 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HCC)    Hematuria    History of kidney stones    Hypertension    Monoclonal gammopathy    Monoclonal gammopathy of unknown significance (MGUS)    Monoclonal paraproteinemia    Tinnitus     Past Surgical History:  Procedure Laterality Date   APPENDECTOMY     CHOLECYSTECTOMY     6/17   COLON SURGERY     ELBOW SURGERY     HAND SURGERY     INNER EAR SURGERY Right    prosthetic   IVC FILTER PLACEMENT ([facility] HX)     IVC FILTER PLACEMENT ([facility] HX)     retrivial   MYRINGOTOMY WITH TUBE PLACEMENT Left 11/22/2015   Procedure: MYRINGOTOMY WITH TUBE PLACEMENT;  Surgeon: Carloyn Manner, MD;  Location: [facility]  [facility];  Service: ENT;  Laterality: Left;   MYRINGOTOMY WITH TUBE PLACEMENT Left 03/14/2020   Procedure: MYRINGOTOMY WITH BUTTERFLY TUBE PLACEMENT;  Surgeon: Carloyn Manner, MD;  Location: [place];  Service: ENT;  Laterality: Left;   SHOULDER SURGERY Left    has broken pin     Physical Exam   Triage Vital Signs: ED Triage Vitals  Enc Vitals Group     BP 06/25/21 1743 (!) 160/96     Pulse Rate 06/25/21 1743 (!) 104     Resp 06/25/21 1743 16     Temp 06/25/21 1743 98 F (36.7 C)     Temp Source 06/25/21 1743 Oral     SpO2 06/25/21 1743 94 %     Weight 06/25/21 1750 190 lb (86.2 kg)     Height 06/25/21 1750 '5\' 9"'$  (1.753 m)     Head Circumference --      Peak Flow --      Pain Score 06/25/21 1748 5     Pain Loc --      Pain Edu? --      Excl. in [place]? --     Most recent vital signs: Vitals:   06/26/21 0130  06/26/21 0200  BP: 132/85 (!) 153/96  Pulse: 89 89  Resp: 19 17  Temp:    SpO2: 97% 98%     Constitutional: Alert and oriented. Well appearing and in no apparent distress. HEENT:      Head: Normocephalic and atraumatic.         Eyes: Conjunctivae are normal. Sclera is non-icteric.       Mouth/Throat: Mucous membranes are moist.       Ear: No acute findings, left ear tube in place      Neck: Supple with no signs of meningismus. Cardiovascular: Regular rate and rhythm. No murmurs, gallops, or rubs. 2+ symmetrical distal pulses are present in all extremities.  Respiratory: Normal respiratory effort. Lungs are clear to auscultation bilaterally.  Gastrointestinal: Soft, non tender, and non distended with positive bowel sounds. No rebound or guarding. Genitourinary: No CVA tenderness. Musculoskeletal:  No edema, cyanosis, or erythema of extremities. Neurologic: Normal speech and language. Face is symmetric.  Intact strength and sensation x4, no pronator drift, no dysmetria, pupils are equal round and reactive, intact extraocular movements, no nystagmus  skin:  Skin is warm, dry and intact. No rash noted. Psychiatric: Mood and affect are normal. Speech and behavior are normal.  ED Results / Procedures / Treatments   Labs (all labs ordered are listed, but only abnormal results are displayed) Labs Reviewed  COMPREHENSIVE METABOLIC PANEL - Abnormal; Notable for the following components:      Result Value   Glucose, Bld 101 (*)    Total Bilirubin 1.3 (*)    All other components within normal limits  URINALYSIS, ROUTINE W REFLEX MICROSCOPIC - Abnormal; Notable for the following components:   Color, Urine YELLOW (*)    APPearance CLEAR (*)    Ketones, ur 20 (*)    Leukocytes,Ua TRACE (*)    All other components within normal limits  RESP PANEL BY RT-PCR (FLU A&B, COVID) ARPGX2  CBC WITH DIFFERENTIAL/PLATELET  CBG MONITORING, ED  CBG MONITORING, ED  TROPONIN I (HIGH SENSITIVITY)  TROPONIN I (HIGH SENSITIVITY)     EKG  ED ECG REPORT I, Rudene Re, the attending physician, personally viewed and interpreted this ECG.  Sinus tachycardia with a rate of 108, normal intervals, normal axis, no ST elevations or depressions   RADIOLOGY I, Rudene Re, attending MD, have personally viewed and interpreted the images obtained during this visit as below:  Chest x-ray negative for pneumonia  CT head negative for any acute findings ___________________________________________________ Interpretation by Radiologist:  DG Chest 1 View  Result Date: 06/25/2021 CLINICAL DATA:  Weakness EXAM: CHEST  1 VIEW COMPARISON:  None Available. FINDINGS: Heart size and vascularity normal. Mild atelectasis in the lung bases. Negative for pneumonia or effusion. IMPRESSION: Mild bibasilar atelectasis. Electronically Signed   By: Franchot Gallo M.D.   On: 06/25/2021 18:14   CT Head Wo Contrast  Result Date: 06/25/2021 CLINICAL DATA:  Dizziness, non-specific EXAM: CT HEAD WITHOUT CONTRAST TECHNIQUE: Contiguous axial images were obtained from the base of  the skull through the vertex without intravenous contrast. RADIATION DOSE REDUCTION: This exam was performed according to the departmental dose-optimization program which includes automated exposure control, adjustment of the mA and/or kV according to patient size and/or use of iterative reconstruction technique. COMPARISON:  None Available. BRAIN: BRAIN Patchy and confluent areas of decreased attenuation are noted throughout the deep and periventricular white matter of the cerebral hemispheres bilaterally, compatible with chronic microvascular ischemic disease. No evidence of large-territorial acute infarction.  No parenchymal hemorrhage. No mass lesion. No extra-axial collection. No mass effect or midline shift. No hydrocephalus. Basilar cisterns are patent. Vascular: No hyperdense vessel. Skull: No acute fracture or focal lesion. Sinuses/Orbits: Paranasal sinuses and mastoid air cells are clear. The orbits are unremarkable. Other: None. IMPRESSION: No acute intracranial abnormality. Electronically Signed   By: Iven Finn M.D.   On: 06/25/2021 21:28       PROCEDURES:  Critical Care performed: No  Procedures    IMPRESSION / MDM / ASSESSMENT AND PLAN / ED COURSE  I reviewed the triage vital signs and the nursing notes.  81 y.o. 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  On exam patient is well-appearing in no distress with normal vital signs, neurologically intact with nonfocal exam.  Ddx: Vertigo versus stroke versus near syncope versus dysrhythmia versus dehydration versus pneumonia since he has had a cough versus COVID versus central venous thrombosis although less likely on Xarelto and with no headache   Plan: Head CT, chest x-ray, COVID and flu swab, EKG and troponin, telemetry to rule monitor for dysrhythmias,  metabolic panel, CBC, urinalysis.  Will give meclizine and  IV fluids.   MEDICATIONS GIVEN IN ED: Medications  clonazePAM (KLONOPIN) tablet 0.5 mg (has no administration in time range)  lactated ringers bolus 1,000 mL (0 mLs Intravenous Stopped 06/26/21 0254)  meclizine (ANTIVERT) tablet 25 mg (25 mg Oral Given 06/26/21 0039)     ED COURSE: CT head for acute pathology is negative.  Unfortunately patient cannot undergo an MRI due to his right ear implant.  Chest x-ray with no signs of pneumonia.  COVID and flu negative.  No leukocytosis, no anemia, no AKI, no significant electrolyte derangements, no hypoglycemia, no signs of demand ischemia or dysrhythmias based on telemetry reads, EKG, and troponins.  UA with mild ketones possibly some mild dehydration.  No signs of UTI.  Plan to reassess after above given medications.  _________________________ 2:56 AM on 06/26/2021 ----------------------------------------- Patient feeling worse.  Repeat EKG showing no acute findings.  Repeat CBG is within normal limits.  Patient is very dizzy, clammy and diaphoretic.  Will consult the hospitalist for admission for dizziness.  Possibly a small stroke that were not seen on CT versus a viral syndrome since patient has felt unwell for a week causing worsening of his vertigo.  After discussion with the hospitalist patient was accepted to their service   Consults: Hospitalist   EMR reviewed including records from his last visit with his primary care doctor from February 2023 for hypertension    FINAL CLINICAL IMPRESSION(S) / ED DIAGNOSES   Final diagnoses:  Dizziness     Rx / DC Orders   ED Discharge Orders     None        Note:  This document was prepared using Dragon voice recognition software and may include unintentional dictation errors.   Please note:  Patient was evaluated in Emergency Department today for the symptoms described in the history of present illness. Patient was evaluated in the context of the global COVID-19 pandemic, which  necessitated consideration that the patient might be at risk for infection with the SARS-CoV-2 virus that causes COVID-19. Institutional protocols and algorithms that pertain to the evaluation of patients at risk for COVID-19 are in a state of rapid change based on information released by regulatory bodies including  the [employer] and federal and state organizations. These policies and algorithms were followed during the patient's care in the ED.  Some ED evaluations and interventions may be delayed as a result of limited staffing during the pandemic.       Alfred Levins, [place], MD 06/26/21 [id number]

## 2021-06-27 DIAGNOSIS — H8110 Benign paroxysmal vertigo, unspecified ear: Secondary | ICD-10-CM | POA: Diagnosis not present

## 2021-06-27 DIAGNOSIS — H8111 Benign paroxysmal vertigo, right ear: Secondary | ICD-10-CM | POA: Diagnosis not present

## 2021-06-27 LAB — BASIC METABOLIC PANEL
Anion gap: 3 — ABNORMAL LOW (ref 5–15)
BUN: 10 mg/dL (ref 8–23)
CO2: 27 mmol/L (ref 22–32)
Calcium: 8.5 mg/dL — ABNORMAL LOW (ref 8.9–10.3)
Chloride: 111 mmol/L (ref 98–111)
Creatinine, Ser: 1.03 mg/dL (ref 0.61–1.24)
GFR, Estimated: >60 mL/min (ref >60)
Glucose, Bld: 86 mg/dL (ref 70–99)
Potassium: 4 mmol/L (ref 3.5–5.1)
Sodium: 141 mmol/L (ref 135–145)

## 2021-06-27 LAB — MAGNESIUM: Magnesium: 2 mg/dL (ref 1.7–2.4)

## 2021-06-27 MED ORDER — MECLIZINE HCL 25 MG PO TABS: 25.0000 mg | ORAL_TABLET | Freq: Three times a day (TID) | ORAL | 0 refills | Status: AC | PRN

## 2021-06-27 NOTE — Progress Notes (Signed)
The patient come from home with his wife He has a rolling walker and cane at home He walked over 500 ft with PT The recommendation is to go to [facility] outpatient pt, Referral will be sent over, they will call him with an appointment His wife provides transportation when he cant drive

## 2021-06-27 NOTE — Progress Notes (Signed)
Physical Therapy Treatment Patient Details Name: Elijah Lopez. MRN: [id number] DOB: 05-07-1940 Today's Date: 06/27/2021   History of Present Illness Elijah Lopez. is an 65yoM who comes to [facility] on 5/30 after nearly 1 week of ongoing transient episodic vertigo associate with bed mobility. Pt reports episodes last less than 60sec, but are vertiginous in nature, no significant nause/vomitting, but he has essentially been immobilized by this. He denies any symptoms provocation once up and walking, but continued to feel weak in general. Pt reports  a similar issue 10 years prior. 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 Pt denies any recent signs/symptoms of sinus or ear infection, no recent changes to hearing or tinnitus. Pt follows with ENT regularly. No recent falls. Pt does not AMB with device at baseline.    PT Comments    Pt reports no dizziness issues or subsequent vertigo after last session. Pt reports no additional antiemetics following dose given ~1200 yesterday. Pt is retested in supine to long sitting with modA, this was the most triggering movement for vertigo yesterday and today it triggers no vertigo. Pt performs all mobility independently, including ascending/descending 4 stairs reciprocally without rail. Pt is ready for DC. Still encouraged a 1x OPPT vestibular visit. PT signing off at this time as pt is back to baseline and fully independence with mobility.     Recommendations for follow up therapy are one component of a multi-disciplinary discharge planning process, led by the attending physician.  Recommendations may be updated based on patient status, additional functional criteria and insurance authorization.  Follow Up Recommendations  Outpatient PT     Assistance Recommended at Discharge Set up Supervision/Assistance  Patient can return home with the following     Equipment Recommendations  None recommended by PT    Recommendations for  Other Services       Precautions / Restrictions Precautions Precautions: Fall Restrictions Weight Bearing Restrictions: No     Mobility  Bed Mobility Overal bed mobility: Independent                  Transfers Overall transfer level: Independent                      Ambulation/Gait Ambulation/Gait assistance: Independent Gait Distance (Feet): 520 Feet Assistive device: None Gait Pattern/deviations: WFL(Within Functional Limits) Gait velocity: 0.72ms today (0.416m on 5/31)     General Gait Details: now at baseline, no dizzines,s no vertigo, no subjective weakness   Stairs Stairs: Yes Stairs assistance: Supervision Stair Management: No rails, Forwards Number of Stairs: 4     Wheelchair Mobility    Modified Rankin (Stroke Patients Only)       Balance                                            Cognition Arousal/Alertness: Awake/alert Behavior During Therapy: WFL for tasks assessed/performed Overall Cognitive Status: Within Functional Limits for tasks assessed                                          Exercises      General Comments        Pertinent Vitals/Pain Pain Assessment Pain Assessment: No/denies pain  Home Living                          Prior Function            PT Goals (current goals can now be found in the care plan section) Acute Rehab PT Goals Patient Stated Goal: improve dizziness, regain tolerance to basic mobility PT Goal Formulation: With patient Time For Goal Achievement: 07/10/21 Potential to Achieve Goals: Good Progress towards PT goals: Goals met/education completed, patient discharged from PT    Frequency    Min 2X/week      PT Plan Current plan remains appropriate    Co-evaluation              AM-PAC PT "6 Clicks" Mobility   Outcome Measure  Help needed turning from your back to your side while in a flat bed without using bedrails?:  None Help needed moving from lying on your back to sitting on the side of a flat bed without using bedrails?: None Help needed moving to and from a bed to a chair (including a wheelchair)?: None Help needed standing up from a chair using your arms (e.g., wheelchair or bedside chair)?: None Help needed to walk in hospital room?: None Help needed climbing 3-5 steps with a railing? : None 6 Click Score: 24    End of Session Equipment Utilized During Treatment: Gait belt Activity Tolerance: Patient tolerated treatment well;No increased pain Patient left: in bed;with call bell/phone within reach Nurse Communication: Mobility status PT Visit Diagnosis: Dizziness and giddiness (R42);BPPV BPPV - Right/Left : Right     Time: 3837-7939 PT Time Calculation (min) (ACUTE ONLY): 8 min  Charges:  $Therapeutic Activity: 8-22 mins                    8:56 AM, 06/27/21 Etta Grandchild, PT, DPT Physical Therapist - [facility]  [phone] ([place])     Azul Brumett C 06/27/2021, 8:53 AM

## 2021-06-28 NOTE — Discharge Summary (Signed)
Physician Discharge Summary   Patient: Elijah Lopez. MRN: [id number] DOB: 1940-06-09  Admit date:     06/25/2021  Discharge date: 06/27/2021  Discharge Physician: Berle Mull  PCP: Tereasa Coop, PA-C  Recommendations at discharge: Follow-up with PCP in 1 week. Follow-up with ENT as recommended.   Continue with outpatient vestibular rehab.  Discharge Diagnoses: Principal Problem:   BPPV (benign paroxysmal positional vertigo) Active Problems:   Anticoagulant long-term use   Factor 5 Leiden mutation, 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HCC)   Anxiety   HTN (hypertension)   At risk for allergic reaction to medication  Assessment and Plan: BPPV Patient with prior episodes of vertigo Vestibular evaluation shows the patient is suffering from anterior canal BPPV. After initial treatment and meclizine and higher dose of Klonopin symptoms resolved and patient was able to ambulate 500 feet without any vertigo. Outpatient follow-up with PCP as well as outpatient vestibular rehab. No indication for inpatient ENT consult but outpatient ENT consult recommended.  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  Anxiety Continue Klonopin  Consultants: none Procedures performed:  none DISCHARGE MEDICATION: Allergies as of 06/27/2021       Reactions   Ceftin [cefuroxime Axetil] Swelling   Trazodone And Nefazodone Anaphylaxis   ams   Eryc [erythromycin] Other (See Comments)   Other reaction(s): Other (See Comments) Other Reaction: OTHER REACTION-"SICK" Feels sick   Sulfamethoxazole Hives   Dilaudid [hydromorphone Hcl] Nausea And Vomiting   Elavil [amitriptyline Hcl] Nausea And Vomiting   Hctz [hydrochlorothiazide]    Other reaction(s):  Other (See Comments) "cant function, can't walk, blood pressure plummeted" Can't walk/bp down   Percodan [oxycodone-aspirin] Nausea And Vomiting   Piroxicam Other (See Comments)   Other reaction(s): UNKNOWN unknown   Promethazine Other (See Comments)   Unknown unknown   Simvastatin Other (See Comments)   Muscle pain   Triamterene Other (See Comments)   UNKNOWN unknown   Tussionex Pennkinetic Er [hydrocod Poli-chlorphe Poli Er] Nausea And Vomiting   sweating   Amitriptyline Nausea And Vomiting   Amoxapine And Related Rash   Amoxicillin Rash   Ativan [lorazepam] Nausea And Vomiting   "can't function"   Codeine Nausea And Vomiting   Erythromycin Base Nausea And Vomiting, Rash   Gabapentin Nausea And Vomiting   Hydromorphone Nausea And Vomiting   Lecithin Nausea And Vomiting   Naproxen Nausea And Vomiting, Rash   Naproxen Sodium Nausea And Vomiting, Rash   Nortriptyline Nausea And Vomiting   Nsaids Nausea And Vomiting, Rash   Orudis [ketoprofen] Rash   Oxycodone-acetaminophen Nausea And Vomiting, Palpitations   Sweating   Sertraline Anxiety   "MAKES ME FEEL LIKE IM OUT OF IT"   Sonata [zaleplon] Anxiety   Insomnia   Zoloft [sertraline Hcl] Anxiety        Medication List     TAKE these medications    amLODipine 5 MG tablet Commonly known as: NORVASC Take 5 mg by mouth daily. 1/2 tab am and pm   clonazePAM 1 MG tablet Commonly known as: KLONOPIN Take 1 mg by mouth 3 (three) times daily.   lisinopril 5 MG tablet Commonly known as: ZESTRIL Take 7.5 mg by mouth daily.   meclizine 25 MG tablet Commonly known as: ANTIVERT Take 1 tablet (25 mg total) by mouth 3 (  three) times daily as needed for dizziness.   omeprazole 20 MG tablet Commonly known as: PRILOSEC OTC Take by mouth.   rivaroxaban 10 MG Tabs tablet Commonly known as: XARELTO Take 10 mg by mouth daily. pm   sodium fluoride 1.1 % Gel dental gel Commonly known as: FLUORISHIELD   TYLENOL 8 HOUR  PO Take by mouth. As needed   Vitamin D3 25 MCG (1000 UT) Caps Take by mouth.        Follow-up Information     Tereasa Coop, PA-C. Schedule an appointment as soon as possible for a visit in 2 week(s).   Specialty: Urgent Care Contact information: [address] [place] [address] [phone]         Carloyn Manner, MD. Schedule an appointment as soon as possible for a visit in 1 month(s).   Specialty: Otolaryngology Contact information: [place] [address] [phone]                Disposition: Home Diet recommendation: Cardiac diet  Discharge Exam: Vitals:   06/26/21 1956 06/27/21 0441 06/27/21 0738 06/27/21 1135  BP: 108/72 115/69 115/72 111/72  Pulse: 76 92 84 73  Resp: 17 18    Temp: 98.8 F (37.1 C) 98.4 F (36.9 C) 98.3 F (36.8 C) 98.2 F (36.8 C)  TempSrc:   Oral Oral  SpO2: 97% 93% 94% 95%  Weight:      Height:       General: Appear in no distress; no visible Abnormal Neck Mass Or lumps, Conjunctiva normal Cardiovascular: S1 and S2 Present, no Murmur, Respiratory: good respiratory effort, Bilateral Air entry present and CTA, no Crackles, no wheezes Abdomen: Bowel Sound present, Non tender  Extremities: no Pedal edema Neurology: alert and oriented to time, place, and person  Gait not checked due to patient safety concerns Filed Weights   06/25/21 1750 06/26/21 1225  Weight: 86.2 kg 87.8 kg   Condition at discharge: stable  The results of significant diagnostics from this hospitalization (including imaging, microbiology, ancillary and laboratory) are listed below for reference.   Imaging Studies: DG Chest 1 View  Result Date: 06/25/2021 CLINICAL DATA:  Weakness EXAM: CHEST  1 VIEW COMPARISON:  None Available. FINDINGS: Heart size and vascularity normal. Mild atelectasis in the lung bases. Negative for pneumonia or effusion. IMPRESSION: Mild bibasilar atelectasis.  Electronically Signed   By: Franchot Gallo M.D.   On: 06/25/2021 18:14   CT Head Wo Contrast  Result Date: 06/25/2021 CLINICAL DATA:  Dizziness, non-specific EXAM: CT HEAD WITHOUT CONTRAST TECHNIQUE: Contiguous axial images were obtained from the base of the skull through the vertex without intravenous contrast. RADIATION DOSE REDUCTION: This exam was performed according to the departmental dose-optimization program which includes automated exposure control, adjustment of the mA and/or kV according to patient size and/or use of iterative reconstruction technique. COMPARISON:  None Available. BRAIN: BRAIN Patchy and confluent areas of decreased attenuation are noted throughout the deep and periventricular white matter of the cerebral hemispheres bilaterally, compatible with chronic microvascular ischemic disease. No evidence of large-territorial acute infarction. No parenchymal hemorrhage. No mass lesion. No extra-axial collection. No mass effect or midline shift. No hydrocephalus. Basilar cisterns are patent. Vascular: No hyperdense vessel. Skull: No acute fracture or focal lesion. Sinuses/Orbits: Paranasal sinuses and mastoid air cells are clear. The orbits are unremarkable. Other: None. IMPRESSION: No acute intracranial abnormality. Electronically Signed   By: Iven Finn M.D.   On:  06/25/2021 21:28    Microbiology: Results for orders placed or performed during the hospital encounter of 06/25/21  Resp Panel by RT-PCR (Flu A&B, Covid) Anterior Nasal Swab     Status: None   Collection Time: 06/25/21  8:43 PM   Specimen: Anterior Nasal Swab  Result Value Ref Range Status   SARS Coronavirus 2 by RT PCR NEGATIVE NEGATIVE Final    Comment: (NOTE) SARS-CoV-2 target nucleic acids are NOT DETECTED.  The SARS-CoV-2 RNA is generally detectable in upper respiratory specimens during the acute phase of infection. The lowest concentration of SARS-CoV-2 viral copies this assay can detect is 138 copies/mL. A  negative result does not preclude SARS-Cov-2 infection and should not be used as the sole basis for treatment or other patient management decisions. A negative result may occur with  improper specimen collection/handling, submission of specimen other than nasopharyngeal swab, presence of viral mutation(s) within the areas targeted by this assay, and inadequate number of viral copies(<138 copies/mL). A negative result must be combined with clinical observations, patient history, and epidemiological information. The expected result is Negative.  Fact Sheet for Patients:  EntrepreneurPulse.com.au  Fact Sheet for Healthcare Providers:  IncredibleEmployment.be  This test is no t yet approved or cleared by the [place] FDA and  has been authorized for detection and/or diagnosis of SARS-CoV-2 by FDA under an Emergency Use Authorization (EUA). This EUA will remain  in effect (meaning this test can be used) for the duration of the COVID-19 declaration under Section 564(b)(1) of the Act, 21 U.S.C.section 360bbb-3(b)(1), unless the authorization is terminated  or revoked sooner.       Influenza A by PCR NEGATIVE NEGATIVE Final   Influenza B by PCR NEGATIVE NEGATIVE Final    Comment: (NOTE) The Xpert Xpress SARS-CoV-2/FLU/RSV plus assay is intended as an aid in the diagnosis of influenza from Nasopharyngeal swab specimens and should not be used as a sole basis for treatment. Nasal washings and aspirates are unacceptable for Xpert Xpress SARS-CoV-2/FLU/RSV testing.  Fact Sheet for Patients: EntrepreneurPulse.com.au  Fact Sheet for Healthcare Providers: IncredibleEmployment.be  This test is not yet approved or cleared by the [place] FDA and has been authorized for detection and/or diagnosis of SARS-CoV-2 by FDA under an Emergency Use Authorization (EUA). This EUA will remain in effect (meaning this test can  be used) for the duration of the COVID-19 declaration under Section 564(b)(1) of the Act, 21 U.S.C. section 360bbb-3(b)(1), unless the authorization is terminated or revoked.  Performed at [facility], [address] [place], [place] [address]     Labs: CBC: Recent Labs  Lab 06/25/21 1745  WBC 5.4  NEUTROABS 3.7  HGB 16.3  HCT 48.7  MCV 99.0  PLT 166   Basic Metabolic Panel: Recent Labs  Lab 06/25/21 1745 06/27/21 0354  NA 141 141  K 5.0 4.0  CL 106 111  CO2 25 27  GLUCOSE 101* 86  BUN 12 10  CREATININE 1.04 1.03  CALCIUM 9.4 8.5*  MG  --  2.0   Liver Function Tests: Recent Labs  Lab 06/25/21 1745  AST 25  ALT 20  ALKPHOS 82  BILITOT 1.3*  PROT 7.6  ALBUMIN 4.5   CBG: Recent Labs  Lab 06/25/21 1749 06/26/21 0253  GLUCAP 97 96    Discharge time spent: greater than 30 minutes.  Signed: Berle Mull, MD [facility] 06/27/2021

## 2022-07-25 ENCOUNTER — Ambulatory Visit
Admission: EM | Admit: 2022-07-25 | Discharge: 2022-07-25 | Disposition: A | Discharge status: Home / Self Care | Payer: Medicare Other | Attending: Emergency Medicine | Admitting: Emergency Medicine

## 2022-07-25 DIAGNOSIS — S0502XA Injury of conjunctiva and corneal abrasion without foreign body, left eye, initial encounter: Secondary | ICD-10-CM

## 2022-07-25 MED ORDER — TOBRAMYCIN-DEXAMETHASONE 0.3-0.1 % OP SUSP: 2.0000 [drp] | Freq: Four times a day (QID) | OPHTHALMIC | 0 refills | Status: AC

## 2022-07-25 NOTE — Discharge Instructions (Signed)
Instill 2 drops of TobraDex in your left eye 4 times a day for 5 days.  Abstain from wearing contacts until your symptoms have completely resolved.  When you do resume wearing contacts start with a fresh pair.  Make sure that you are storing your contracts in a clean with clean saline nightly.  Wash your contacts with an enzymatic cleaner once weekly and rinse them thoroughly with new, clean contact lens solution to remove any enzymatic residue.  If you have any worsening of your symptoms such as increased pain, increased redness, increased photosensitivity, or changes in your vision you need to follow-up with your eye doctor.  

## 2022-07-25 NOTE — ED Triage Notes (Signed)
Pt states he was walking outside and got something in his left eye around Monday. Pt tried flushing it out with eye drops and felt better then this morning it started to hurt and having clear drainage and pain opening eye.

## 2022-07-25 NOTE — ED Provider Notes (Signed)
[facility]    CSN: [id number] Arrival date & time: 07/25/22  0834      History   Chief Complaint Chief Complaint  Patient presents with   Foreign Body in Eye    HPI Elijah Lopez. is a 82 y.o. male.   HPI  82 year old male with past medical history significant for BPH, blood dyscrasias, skin cancer, anxiety, DVT, factor V Leiden, hypertension, and monoclonal gammopathy presents for evaluation of left thigh pain.  He reports that he walked outside 5 days ago and felt something get in his eye.  He is unsure of what it was.  He states that he has been  using eyedrops at home which has been controlling his symptoms until around 4 AM this morning.  At that time he awoke with a deep burning in his eye as well as excessive tearing.  He denies any changes to his vision.  He does have an eye doctor and had a routine eye exam several weeks ago with no abnormalities found.  Past Medical History:  Diagnosis Date   Amnesia, global, transient    Anxiety    Blood dyscrasia    BPH (benign prostatic hyperplasia)    Cancer (HCC)    skin   DVT (deep venous thrombosis) (HCC)    Factor 5 Leiden mutation, 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HCC)    Hematuria    History of kidney stones    Hypertension    Monoclonal gammopathy    Monoclonal gammopathy of unknown significance (MGUS)    Monoclonal paraproteinemia    Tinnitus     Patient Active Problem List   Diagnosis Date Noted   BPPV (benign paroxysmal positional vertigo) 06/26/2021   At risk for allergic reaction to medication    Hyperlipidemia, unspecified 04/10/2016   Factor 5 Leiden mutation, 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HCC) 11/14/2015   S/P cholecystectomy 10/28/2015   Anxiety 07/17/2015   Transaminitis 06/24/2015   History of malignant neoplasm of skin 11/14/2014   Personal history of other malignant neoplasm of skin 11/14/2014   Borderline diabetes mellitus 08/12/2014   Prediabetes 08/12/2014   Family history of cardiac disorder 02/10/2014    FH: heart disease 02/10/2014   Anxiety state 09/13/2013   Hyperlipidemia 09/13/2013   Essential hypertension 10/25/2012   HTN (hypertension) 10/25/2012   Hypertension 10/25/2012   Insomnia 10/25/2012   Bladder stone 09/01/2012   Deep vein thrombosis (DVT) (HCC) 09/01/2012   Diverticula of colon 09/01/2012   Heterozygous factor V Leiden mutation (HCC) 09/01/2012   MGUS (monoclonal gammopathy of unknown significance) 09/01/2012   S/P colectomy 09/01/2012   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   Dysuria 08/26/2012   Gross hematuria 08/26/2012   Slowing of urinary stream 08/26/2012   Anticoagulant long-term use 08/25/2011   Encounter for current long-term use of anticoagulants 08/25/2011   Long term current use of anticoagulant therapy 08/25/2011   Diverticular disease 07/22/2011   Diverticulosis of colon 07/22/2011   Diverticulosis of intestine 07/22/2011   Monoclonal gammopathy 07/22/2011   Monoclonal paraproteinemia 07/22/2011   Urinary calculus 07/22/2011   Urolithiasis 07/22/2011   Dyspepsia 01/08/2011   Dyspepsia and disorder of function of stomach 01/08/2011   Indigestion 01/08/2011   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    Past Surgical History:  Procedure Laterality Date   APPENDECTOMY     CHOLECYSTECTOMY     6/17   COLON SURGERY     ELBOW SURGERY     HAND SURGERY  INNER EAR SURGERY Right    prosthetic   IVC FILTER PLACEMENT ([facility] HX)     IVC FILTER PLACEMENT ([facility] HX)     retrivial   MYRINGOTOMY WITH TUBE PLACEMENT Left 11/22/2015   Procedure: MYRINGOTOMY WITH TUBE PLACEMENT;  Surgeon: Bud Face, MD;  Location: [facility] ORS;  Service: ENT;  Laterality: Left;   MYRINGOTOMY WITH TUBE PLACEMENT Left 03/14/2020   Procedure: MYRINGOTOMY WITH BUTTERFLY TUBE PLACEMENT;  Surgeon: Bud Face, MD;  [facility];  Service: ENT;  Laterality: Left;   SHOULDER SURGERY Left    has broken  pin       Home Medications    Prior to Admission medications   Medication Sig Start Date End Date Taking? Authorizing Provider  Acetaminophen (TYLENOL 8 HOUR PO) Take by mouth. As needed   Yes [provider]  amLODipine (NORVASC) 5 MG tablet Take 5 mg by mouth daily. 1/2 tab am and pm   Yes [provider]  Cholecalciferol (VITAMIN D3) 1000 units CAPS Take by mouth.   Yes [provider]  clonazePAM (KLONOPIN) 1 MG tablet Take 1 mg by mouth 3 (three) times daily.   Yes [provider]  lisinopril (PRINIVIL,ZESTRIL) 5 MG tablet Take 7.5 mg by mouth daily.   Yes [provider]  rivaroxaban (XARELTO) 10 MG TABS tablet Take 10 mg by mouth daily. pm   Yes [provider]  sodium fluoride (FLUORISHIELD) 1.1 % GEL dental gel  04/17/13  Yes [provider]  tobramycin-dexamethasone (TOBRADEX) ophthalmic solution Place 2 drops into the left eye every 6 (six) hours. 07/25/22  Yes Becky Augusta, NP  meclizine (ANTIVERT) 25 MG tablet Take 1 tablet (25 mg total) by mouth 3 (three) times daily as needed for dizziness. 06/27/21   Rolly Salter, MD  omeprazole (PRILOSEC OTC) 20 MG tablet Take by mouth. 04/04/13   [provider]    Family History Family History  Problem Relation Age of Onset   Other Mother 69       "natural causes"   Hypertension Mother    Diverticulitis Mother    Heart attack Father 72    Social History Social History   Tobacco Use   Smoking status: Some Days    Types: Cigars   Smokeless tobacco: Never  Vaping Use   Vaping Use: Never used  Substance Use Topics   Alcohol use: Yes    Comment: rare   Drug use: No     Allergies   Ceftin [cefuroxime axetil], Trazodone and nefazodone, Eryc [erythromycin], Sulfamethoxazole, Dilaudid [hydromorphone hcl], Elavil [amitriptyline hcl], Hctz [hydrochlorothiazide], Percodan [oxycodone-aspirin], Piroxicam, Promethazine, Simvastatin, Triamterene, Tussionex  pennkinetic er [employer] poli-chlorphe poli er], Amitriptyline, Amoxapine and related, Amoxicillin, Ativan [lorazepam], Codeine, Erythromycin base, Gabapentin, Hydromorphone, Lecithin, Naproxen, Naproxen sodium, Nortriptyline, Nsaids, Orudis [ketoprofen], Oxycodone-acetaminophen, Sertraline, Sonata [zaleplon], and Zoloft [sertraline hcl]   Review of Systems Review of Systems  Constitutional:  Negative for fever.  Eyes:  Positive for photophobia, pain, discharge and redness. Negative for visual disturbance.     Physical Exam Triage Vital Signs ED Triage Vitals  Enc Vitals Group     BP 07/25/22 0845 131/72     Pulse Rate 07/25/22 0845 82     Resp 07/25/22 0845 16     Temp 07/25/22 0845 97.7 F (36.5 C)     Temp Source 07/25/22 0845 Oral     SpO2 07/25/22 0845 97 %     Weight --      Height --  Head Circumference --      Peak Flow --      Pain Score 07/25/22 0848 7     Pain Loc --      Pain Edu? --      Excl. in GC? --    No data found.  Updated Vital Signs BP 131/72 (BP Location: Right Arm)   Pulse 82   Temp 97.7 F (36.5 C) (Oral)   Resp 16   SpO2 97%   Visual Acuity Right Eye Distance:   Left Eye Distance:   Bilateral Distance:    Right Eye Near:   Left Eye Near:    Bilateral Near:     Physical Exam Vitals and nursing note reviewed.  Constitutional:      Appearance: Normal appearance. He is not ill-appearing.  HENT:     Head: Normocephalic and atraumatic.  Eyes:     Extraocular Movements: Extraocular movements intact.     Pupils: Pupils are equal, round, and reactive to light.     Comments: Labral conjunctiva of the left eye are erythematous and injected.  Pupils equal and reactive and EOMs intact.  No appreciable foreign body on the surface of the eyeball or with inversion of the upper and lower eyelid.  Skin:    General: Skin is warm and dry.     Capillary Refill: Capillary refill takes less than 2 seconds.  Neurological:     General: No focal  deficit present.     Mental Status: He is alert and oriented to person, place, and time.      UC Treatments / Results  Labs (all labs ordered are listed, but only abnormal results are displayed) Labs Reviewed - No data to display  EKG   Radiology No results found.  Procedures Procedures (including critical care time)  Medications Ordered in UC Medications - No data to display  Initial Impression / Assessment and Plan / UC Course  I have reviewed the triage vital signs and the nursing notes.  Pertinent labs & imaging results that were available during my care of the patient were reviewed by me and considered in my medical decision making (see chart for details).   Patient is a pleasant, nontoxic-appearing 82 year old male presenting for evaluation of left eye pain as outlined in HPI above.  As you can see in the image above there is injection to the lower aspect of the left eye as well as to the labral conjunctiva.  No appreciable foreign body.  I anesthetized the eye with 2 drops of tetracaine and stained using fluorescein dye for a Woods lamp examination which revealed a corneal abrasion in the inferior aspect of the iris.  Again, no foreign body was noted.  I will treat the patient for corneal abrasion with TobraDex, 2 drops in his left eye 4 times a day for 5 days.  I have advised him that if his symptoms do not improve, or they worsen, that he needs to follow-up with his eye doctor.  Final Clinical Impressions(s) / UC Diagnoses   Final diagnoses:  Abrasion of left cornea, initial encounter     Discharge Instructions      Instill 2 drops of TobraDex in your left eye 4 times a day for 5 days.  Abstain from wearing contacts until your symptoms have completely resolved.  When you do resume wearing contacts start with a fresh pair.  Make sure that you are storing your contracts in a clean with clean saline nightly.  Wash  your contacts with an enzymatic cleaner once weekly  and rinse them thoroughly with new, clean contact lens solution to remove any enzymatic residue.  If you have any worsening of your symptoms such as increased pain, increased redness, increased photosensitivity, or changes in your vision you need to follow-up with your eye doctor.      ED Prescriptions     Medication Sig Dispense Auth. Provider   tobramycin-dexamethasone [facility]) ophthalmic solution Place 2 drops into the left eye every 6 (six) hours. 5 mL Becky Augusta, NP      PDMP not reviewed this encounter.   Becky Augusta, NP 07/25/22 [phone]
